# Patient Record
Sex: Male | Born: 1990 | Race: White | Hispanic: No | Marital: Single | Smoking: Current every day smoker
Health system: Southern US, Community
[De-identification: ages and names within clinical notes are randomized; demographics above are authoritative.]

## PROBLEM LIST (undated history)

## (undated) VITALS — BP 138/80 | HR 101 | Temp 97.0°F | Resp 19 | Ht 62.0 in | Wt 175.0 lb

## (undated) DIAGNOSIS — H3320 Serous retinal detachment, unspecified eye: Secondary | ICD-10-CM

## (undated) DIAGNOSIS — S42301A Unspecified fracture of shaft of humerus, right arm, initial encounter for closed fracture: Secondary | ICD-10-CM

## (undated) DIAGNOSIS — J45909 Unspecified asthma, uncomplicated: Secondary | ICD-10-CM

## (undated) DIAGNOSIS — F99 Mental disorder, not otherwise specified: Secondary | ICD-10-CM

## (undated) DIAGNOSIS — J189 Pneumonia, unspecified organism: Secondary | ICD-10-CM

## (undated) DIAGNOSIS — I10 Essential (primary) hypertension: Secondary | ICD-10-CM

## (undated) HISTORY — DX: Pneumonia, unspecified organism: J18.9

## (undated) HISTORY — PX: OTHER SURGICAL HISTORY: SHX169

## (undated) HISTORY — PX: CARDIAC SURGERY: SHX584

## (undated) HISTORY — DX: Serous retinal detachment, unspecified eye: H33.20

## (undated) HISTORY — DX: Essential (primary) hypertension: I10

## (undated) HISTORY — DX: Unspecified asthma, uncomplicated: J45.909

---
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
## 2009-05-02 ENCOUNTER — Ambulatory Visit: Payer: Self-pay | Admitting: Family Medicine

## 2009-05-02 DIAGNOSIS — B079 Viral wart, unspecified: Secondary | ICD-10-CM | POA: Insufficient documentation

## 2009-05-02 DIAGNOSIS — R21 Rash and other nonspecific skin eruption: Secondary | ICD-10-CM | POA: Insufficient documentation

## 2009-05-04 LAB — CONVERTED CEMR LAB
Albumin: 4.7 g/dL (ref 3.5–5.2)
Alkaline Phosphatase: 63 units/L (ref 39–117)
Basophils Absolute: 0 10*3/uL (ref 0.0–0.1)
Bilirubin, Direct: 0.3 mg/dL (ref 0.0–0.3)
Calcium: 9.9 mg/dL (ref 8.4–10.5)
Cholesterol: 111 mg/dL (ref 0–200)
HDL: 38.5 mg/dL — ABNORMAL LOW (ref >39.00)
Hemoglobin: 14.9 g/dL (ref 13.0–17.0)
MCHC: 34.7 g/dL (ref 30.0–36.0)
MCV: 91.9 fL (ref 78.0–100.0)
Monocytes Absolute: 0.3 10*3/uL (ref 0.1–1.0)
Neutro Abs: 7.1 10*3/uL (ref 1.4–7.7)
RBC: 4.67 M/uL (ref 4.22–5.81)
RDW: 11.9 % (ref 11.5–14.6)
Total CHOL/HDL Ratio: 3
Triglycerides: 63 mg/dL (ref 0.0–149.0)
VLDL: 12.6 mg/dL (ref 0.0–40.0)
WBC: 10.6 10*3/uL — ABNORMAL HIGH (ref 4.5–10.5)

## 2009-05-31 ENCOUNTER — Encounter: Payer: Self-pay | Admitting: Family Medicine

## 2009-08-29 ENCOUNTER — Emergency Department (HOSPITAL_COMMUNITY): Admission: EM | Admit: 2009-08-29 | Discharge: 2009-08-29 | Payer: Self-pay | Admitting: Emergency Medicine

## 2009-12-15 HISTORY — PX: OTHER SURGICAL HISTORY: SHX169

## 2010-02-27 ENCOUNTER — Ambulatory Visit: Payer: Self-pay | Admitting: Physical Medicine & Rehabilitation

## 2010-03-01 ENCOUNTER — Inpatient Hospital Stay (HOSPITAL_COMMUNITY)
Admission: RE | Admit: 2010-03-01 | Discharge: 2010-03-07 | Payer: Self-pay | Admitting: Physical Medicine & Rehabilitation

## 2010-03-07 ENCOUNTER — Encounter: Payer: Self-pay | Admitting: Family Medicine

## 2010-11-21 ENCOUNTER — Inpatient Hospital Stay (HOSPITAL_COMMUNITY): Admission: EM | Admit: 2010-11-21 | Discharge: 2010-03-01 | Payer: Self-pay | Admitting: Emergency Medicine
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
## 2011-01-25 ENCOUNTER — Emergency Department (HOSPITAL_COMMUNITY)
Admission: EM | Admit: 2011-01-25 | Discharge: 2011-01-25 | Disposition: A | Discharge status: Home / Self Care | Payer: 59 | Attending: Emergency Medicine | Admitting: Emergency Medicine

## 2011-01-25 DIAGNOSIS — J45909 Unspecified asthma, uncomplicated: Secondary | ICD-10-CM | POA: Insufficient documentation

## 2011-01-25 DIAGNOSIS — F17200 Nicotine dependence, unspecified, uncomplicated: Secondary | ICD-10-CM | POA: Insufficient documentation

## 2011-01-25 DIAGNOSIS — R4182 Altered mental status, unspecified: Secondary | ICD-10-CM | POA: Insufficient documentation

## 2011-01-25 LAB — POCT I-STAT, CHEM 8
BUN: 7 mg/dL (ref 6–23)
Calcium, Ion: 1.14 mmol/L (ref 1.12–1.32)
Chloride: 105 mEq/L (ref 96–112)
Creatinine, Ser: 0.9 mg/dL (ref 0.4–1.5)
Hemoglobin: 16 g/dL (ref 13.0–17.0)
TCO2: 21 mmol/L (ref 0–100)

## 2011-01-25 LAB — RAPID URINE DRUG SCREEN, HOSP PERFORMED
Barbiturates: NOT DETECTED
Benzodiazepines: NOT DETECTED
Tetrahydrocannabinol: POSITIVE — AB

## 2011-02-19 ENCOUNTER — Ambulatory Visit (HOSPITAL_COMMUNITY): Payer: 59 | Admitting: Physician Assistant

## 2011-02-21 ENCOUNTER — Emergency Department (HOSPITAL_COMMUNITY): Payer: 59

## 2011-02-21 ENCOUNTER — Inpatient Hospital Stay (HOSPITAL_COMMUNITY)
Admission: AD | Admit: 2011-02-21 | Discharge: 2011-03-03 | DRG: 885 | Disposition: A | Discharge status: Home / Self Care | Payer: 59 | Source: Ambulatory Visit | Attending: Psychiatry | Admitting: Psychiatry

## 2011-02-21 ENCOUNTER — Emergency Department (HOSPITAL_COMMUNITY)
Admission: EM | Admit: 2011-02-21 | Discharge: 2011-02-21 | Disposition: A | Discharge status: Other Acute Inpatient Hospital | Payer: 59 | Attending: Emergency Medicine | Admitting: Emergency Medicine

## 2011-02-21 DIAGNOSIS — H5440 Blindness, one eye, unspecified eye: Secondary | ICD-10-CM

## 2011-02-21 DIAGNOSIS — F29 Unspecified psychosis not due to a substance or known physiological condition: Secondary | ICD-10-CM

## 2011-02-21 DIAGNOSIS — R404 Transient alteration of awareness: Secondary | ICD-10-CM | POA: Insufficient documentation

## 2011-02-21 DIAGNOSIS — H5316 Psychophysical visual disturbances: Secondary | ICD-10-CM | POA: Insufficient documentation

## 2011-02-21 DIAGNOSIS — J45909 Unspecified asthma, uncomplicated: Secondary | ICD-10-CM

## 2011-02-21 DIAGNOSIS — F1210 Cannabis abuse, uncomplicated: Secondary | ICD-10-CM

## 2011-02-21 LAB — RAPID URINE DRUG SCREEN, HOSP PERFORMED
Amphetamines: NOT DETECTED
Barbiturates: NOT DETECTED
Benzodiazepines: POSITIVE — AB
Tetrahydrocannabinol: POSITIVE — AB

## 2011-02-21 LAB — URINALYSIS, ROUTINE W REFLEX MICROSCOPIC
Bilirubin Urine: NEGATIVE
Nitrite: NEGATIVE
Specific Gravity, Urine: 1.02 (ref 1.005–1.030)

## 2011-02-21 LAB — COMPREHENSIVE METABOLIC PANEL
ALT: 20 U/L (ref 0–53)
AST: 31 U/L (ref 0–37)
BUN: 13 mg/dL (ref 6–23)
Calcium: 9.2 mg/dL (ref 8.4–10.5)
Creatinine, Ser: 0.85 mg/dL (ref 0.4–1.5)
GFR calc non Af Amer: >60 mL/min (ref >60)
Glucose, Bld: 115 mg/dL — ABNORMAL HIGH (ref 70–99)
Potassium: 3.8 mEq/L (ref 3.5–5.1)
Total Protein: 7 g/dL (ref 6.0–8.3)

## 2011-02-21 LAB — CBC
Hemoglobin: 13.6 g/dL (ref 13.0–17.0)
MCH: 31.3 pg (ref 26.0–34.0)
MCHC: 33.6 g/dL (ref 30.0–36.0)
MCV: 93.3 fL (ref 78.0–100.0)
RDW: 12.5 % (ref 11.5–15.5)

## 2011-02-21 LAB — DIFFERENTIAL
Basophils Relative: 1 % (ref 0–1)
Lymphocytes Relative: 17 % (ref 12–46)
Monocytes Relative: 7 % (ref 3–12)
Neutro Abs: 6.6 10*3/uL (ref 1.7–7.7)
Neutrophils Relative %: 72 % (ref 43–77)

## 2011-02-22 DIAGNOSIS — F259 Schizoaffective disorder, unspecified: Secondary | ICD-10-CM

## 2011-02-25 LAB — COMPREHENSIVE METABOLIC PANEL
ALT: 25 U/L (ref 0–53)
Alkaline Phosphatase: 57 U/L (ref 39–117)
BUN: 10 mg/dL (ref 6–23)
Chloride: 105 mEq/L (ref 96–112)
GFR calc Af Amer: >60 mL/min (ref >60)
Potassium: 3.5 mEq/L (ref 3.5–5.1)
Sodium: 139 mEq/L (ref 135–145)
Total Bilirubin: 0.6 mg/dL (ref 0.3–1.2)

## 2011-02-25 LAB — HEMOGLOBIN A1C: Mean Plasma Glucose: 114 mg/dL (ref <117)

## 2011-02-25 LAB — LIPID PANEL
Cholesterol: 97 mg/dL (ref 0–200)
LDL Cholesterol: 43 mg/dL (ref 0–99)
VLDL: 10 mg/dL (ref 0–40)

## 2011-02-25 LAB — VALPROIC ACID LEVEL: Valproic Acid Lvl: 116.1 ug/mL — ABNORMAL HIGH (ref 50.0–100.0)
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
## 2011-03-10 LAB — CBC
HCT: 33.6 % — ABNORMAL LOW (ref 39.0–52.0)
HCT: 47.4 % (ref 39.0–52.0)
Hemoglobin: 11.4 g/dL — ABNORMAL LOW (ref 13.0–17.0)
Hemoglobin: 16.2 g/dL (ref 13.0–17.0)
MCHC: 34.1 g/dL (ref 30.0–36.0)
MCHC: 35.1 g/dL (ref 30.0–36.0)
MCV: 94.5 fL (ref 78.0–100.0)
MCV: 95.8 fL (ref 78.0–100.0)
Platelets: 190 10*3/uL (ref 150–400)
Platelets: 256 10*3/uL (ref 150–400)
Platelets: 312 10*3/uL (ref 150–400)
RBC: 4.99 MIL/uL (ref 4.22–5.81)
RDW: 12.6 % (ref 11.5–15.5)
WBC: 11.4 10*3/uL — ABNORMAL HIGH (ref 4.0–10.5)
WBC: 7.2 10*3/uL (ref 4.0–10.5)

## 2011-03-10 LAB — BASIC METABOLIC PANEL
BUN: 11 mg/dL (ref 6–23)
BUN: 7 mg/dL (ref 6–23)
CO2: 22 mEq/L (ref 19–32)
Calcium: 9.2 mg/dL (ref 8.4–10.5)
Chloride: 107 mEq/L (ref 96–112)
Creatinine, Ser: 0.74 mg/dL (ref 0.4–1.5)
Creatinine, Ser: 0.76 mg/dL (ref 0.4–1.5)
GFR calc non Af Amer: >60 mL/min (ref >60)
Glucose, Bld: 123 mg/dL — ABNORMAL HIGH (ref 70–99)
Potassium: 4.3 mEq/L (ref 3.5–5.1)

## 2011-03-10 LAB — COMPREHENSIVE METABOLIC PANEL
ALT: 28 U/L (ref 0–53)
Albumin: 3.1 g/dL — ABNORMAL LOW (ref 3.5–5.2)
Alkaline Phosphatase: 45 U/L (ref 39–117)
Alkaline Phosphatase: 67 U/L (ref 39–117)
BUN: 11 mg/dL (ref 6–23)
BUN: 11 mg/dL (ref 6–23)
CO2: 21 mEq/L (ref 19–32)
Chloride: 101 mEq/L (ref 96–112)
GFR calc non Af Amer: >60 mL/min (ref >60)
Glucose, Bld: 121 mg/dL — ABNORMAL HIGH (ref 70–99)
Potassium: 3.4 mEq/L — ABNORMAL LOW (ref 3.5–5.1)
Potassium: 3.7 mEq/L (ref 3.5–5.1)
Sodium: 138 mEq/L (ref 135–145)
Total Bilirubin: 0.8 mg/dL (ref 0.3–1.2)

## 2011-03-10 LAB — DIFFERENTIAL
Basophils Absolute: 0.1 10*3/uL (ref 0.0–0.1)
Basophils Relative: 1 % (ref 0–1)
Eosinophils Absolute: 0.5 10*3/uL (ref 0.0–0.7)
Eosinophils Relative: 7 % — ABNORMAL HIGH (ref 0–5)
Monocytes Absolute: 0.8 10*3/uL (ref 0.1–1.0)
Neutro Abs: 3.6 10*3/uL (ref 1.7–7.7)

## 2011-03-10 LAB — POCT I-STAT, CHEM 8
Chloride: 108 mEq/L (ref 96–112)
Creatinine, Ser: 0.9 mg/dL (ref 0.4–1.5)
Hemoglobin: 17 g/dL (ref 13.0–17.0)
Potassium: 3.8 mEq/L (ref 3.5–5.1)
Sodium: 138 mEq/L (ref 135–145)

## 2011-03-10 LAB — LACTIC ACID, PLASMA: Lactic Acid, Venous: 4.4 mmol/L — ABNORMAL HIGH (ref 0.5–2.2)
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
## 2012-08-03 ENCOUNTER — Encounter: Payer: Self-pay | Admitting: Family Medicine

## 2012-08-03 ENCOUNTER — Ambulatory Visit (INDEPENDENT_AMBULATORY_CARE_PROVIDER_SITE_OTHER): Payer: 59 | Admitting: Family Medicine

## 2012-08-03 VITALS — BP 120/64 | Temp 97.1°F | Ht 62.0 in | Wt 190.8 lb

## 2012-08-03 DIAGNOSIS — F209 Schizophrenia, unspecified: Secondary | ICD-10-CM | POA: Insufficient documentation

## 2012-08-03 NOTE — Assessment & Plan Note (Signed)
Pt per hx has had inpatient stay last year followed by treatment (with admin of med at home)  After 9 mo off med (they cannot tell me which)- he is again having hallucinations (visual/ auditory), confusion, difficulty concentrating and wandering behavior  Here for ref to behavioral health- will do asap  Advised if symptoms worsen or he becomes threat to self or others will take him to ER for eval immediately Otherwise family will keep him with them at all times
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
## 2012-08-03 NOTE — Patient Instructions (Addendum)
Please stop and see [name] at check out for referral to behavioral health  If symptoms worsen or you think Hunter [name] is a threat to himself or others- please take him to ER

## 2012-08-04 ENCOUNTER — Inpatient Hospital Stay (HOSPITAL_COMMUNITY)
Admission: EM | Admit: 2012-08-04 | Discharge: 2012-08-13 | DRG: 493 | Disposition: A | Discharge status: Other Acute Inpatient Hospital | Payer: 59 | Attending: Orthopedic Surgery | Admitting: Orthopedic Surgery

## 2012-08-04 ENCOUNTER — Encounter (HOSPITAL_COMMUNITY): Payer: Self-pay | Admitting: *Deleted

## 2012-08-04 ENCOUNTER — Emergency Department (HOSPITAL_COMMUNITY): Payer: 59

## 2012-08-04 DIAGNOSIS — Z8669 Personal history of other diseases of the nervous system and sense organs: Secondary | ICD-10-CM

## 2012-08-04 DIAGNOSIS — S42309A Unspecified fracture of shaft of humerus, unspecified arm, initial encounter for closed fracture: Secondary | ICD-10-CM

## 2012-08-04 DIAGNOSIS — F29 Unspecified psychosis not due to a substance or known physiological condition: Secondary | ICD-10-CM

## 2012-08-04 DIAGNOSIS — F209 Schizophrenia, unspecified: Secondary | ICD-10-CM

## 2012-08-04 DIAGNOSIS — X58XXXA Exposure to other specified factors, initial encounter: Secondary | ICD-10-CM

## 2012-08-04 DIAGNOSIS — S42413A Displaced simple supracondylar fracture without intercondylar fracture of unspecified humerus, initial encounter for closed fracture: Principal | ICD-10-CM | POA: Diagnosis present

## 2012-08-04 DIAGNOSIS — Y9210 Unspecified residential institution as the place of occurrence of the external cause: Secondary | ICD-10-CM | POA: Diagnosis present

## 2012-08-04 DIAGNOSIS — R339 Retention of urine, unspecified: Secondary | ICD-10-CM | POA: Diagnosis not present

## 2012-08-04 DIAGNOSIS — F200 Paranoid schizophrenia: Secondary | ICD-10-CM

## 2012-08-04 LAB — COMPREHENSIVE METABOLIC PANEL
ALT: 56 U/L — ABNORMAL HIGH (ref 0–53)
AST: 128 U/L — ABNORMAL HIGH (ref 0–37)
Albumin: 4.4 g/dL (ref 3.5–5.2)
CO2: 20 mEq/L (ref 19–32)
Calcium: 9.4 mg/dL (ref 8.4–10.5)
Chloride: 101 mEq/L (ref 96–112)
GFR calc non Af Amer: >90 mL/min (ref >90)
Sodium: 137 mEq/L (ref 135–145)
Total Bilirubin: 1 mg/dL (ref 0.3–1.2)

## 2012-08-04 LAB — CBC WITH DIFFERENTIAL/PLATELET
Basophils Absolute: 0.1 10*3/uL (ref 0.0–0.1)
Lymphocytes Relative: 21 % (ref 12–46)
Neutro Abs: 9.9 10*3/uL — ABNORMAL HIGH (ref 1.7–7.7)
Platelets: 262 10*3/uL (ref 150–400)
RDW: 12.5 % (ref 11.5–15.5)
WBC: 14.4 10*3/uL — ABNORMAL HIGH (ref 4.0–10.5)

## 2012-08-04 LAB — RAPID URINE DRUG SCREEN, HOSP PERFORMED
Amphetamines: NOT DETECTED
Barbiturates: NOT DETECTED
Cocaine: NOT DETECTED
Tetrahydrocannabinol: POSITIVE — AB

## 2012-08-04 MED ORDER — OXYCODONE-ACETAMINOPHEN 5-325 MG PO TABS
2.0000 | ORAL_TABLET | Freq: Four times a day (QID) | ORAL | Status: DC | PRN
  Administered 2012-08-05 – 2012-08-07 (×6): 2 via ORAL
  Administered 2012-08-08: 1 via ORAL
  Administered 2012-08-08 – 2012-08-10 (×4): 2 via ORAL
  Filled 2012-08-04 (×12): qty 2

## 2012-08-04 MED ORDER — VALPROIC ACID 250 MG PO CAPS
1000.0000 mg | ORAL_CAPSULE | Freq: Every day | ORAL | Status: DC
  Administered 2012-08-04 – 2012-08-12 (×9): 1000 mg via ORAL
  Filled 2012-08-04 (×11): qty 4

## 2012-08-04 MED ORDER — FENTANYL CITRATE 0.05 MG/ML IJ SOLN
100.0000 ug | Freq: Once | INTRAMUSCULAR | Status: AC
  Administered 2012-08-04: 100 ug via INTRAVENOUS
  Filled 2012-08-04 (×2): qty 2

## 2012-08-04 MED ORDER — CLONAZEPAM 1 MG PO TABS
1.0000 mg | ORAL_TABLET | Freq: Every day | ORAL | Status: DC
  Administered 2012-08-04 – 2012-08-13 (×9): 1 mg via ORAL
  Filled 2012-08-04 (×3): qty 2
  Filled 2012-08-04: qty 1
  Filled 2012-08-04 (×2): qty 2
  Filled 2012-08-04: qty 1
  Filled 2012-08-04: qty 2
  Filled 2012-08-04: qty 1

## 2012-08-04 MED ORDER — RISPERIDONE 1 MG PO TABS
1.0000 mg | ORAL_TABLET | Freq: Every day | ORAL | Status: DC
  Administered 2012-08-04 – 2012-08-05 (×2): 1 mg via ORAL
  Filled 2012-08-04 (×2): qty 1

## 2012-08-04 MED ORDER — IBUPROFEN 600 MG PO TABS
600.0000 mg | ORAL_TABLET | Freq: Three times a day (TID) | ORAL | Status: DC | PRN
  Administered 2012-08-05 – 2012-08-09 (×4): 600 mg via ORAL
  Filled 2012-08-04: qty 1
  Filled 2012-08-04 (×4): qty 3

## 2012-08-04 MED ORDER — SODIUM CHLORIDE 0.9 % IV SOLN
Freq: Once | INTRAVENOUS | Status: AC
  Administered 2012-08-04 – 2012-08-05 (×2): via INTRAVENOUS

## 2012-08-04 MED ORDER — LORAZEPAM 2 MG/ML IJ SOLN
1.0000 mg | Freq: Once | INTRAMUSCULAR | Status: AC
  Administered 2012-08-04: 19:00:00 via INTRAVENOUS
  Filled 2012-08-04: qty 1

## 2012-08-04 MED ORDER — HYDROMORPHONE HCL PF 2 MG/ML IJ SOLN
INTRAMUSCULAR | Status: AC
  Filled 2012-08-04: qty 1

## 2012-08-04 MED ORDER — LORAZEPAM 1 MG PO TABS
1.0000 mg | ORAL_TABLET | Freq: Three times a day (TID) | ORAL | Status: DC | PRN
  Administered 2012-08-04 – 2012-08-13 (×6): 1 mg via ORAL
  Filled 2012-08-04 (×6): qty 1
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
## 2012-08-04 NOTE — ED Notes (Signed)
Pt redirected back to room.

## 2012-08-04 NOTE — ED Notes (Signed)
Pt attempting to go into other patients room.  Refusing to go back into his room, escorted back to his room by this nurse.

## 2012-08-04 NOTE — ED Notes (Signed)
Mom and dad at bedside pain meds given

## 2012-08-04 NOTE — ED Notes (Signed)
  Dr Stephenie Acres at bedside complete assessment done Pt was asked  if he wanted pain meds via iv or in his arm (im) he replied iv
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
## 2012-08-04 NOTE — ED Notes (Signed)
Pt is very uncooperative towards me and the RN. Vernita Banker) was unable to put an IV in because he would continuously fight against [place] even after his father asked him to behave.  Pt coughed into my face as a way to tell me to get away from him as I was trying to help RN while she was trying to put in the IV.
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
## 2012-08-04 NOTE — ED Notes (Addendum)
Pt changed into blue scrubs. Pants, underwear, shirt, shoes placed in one belonging bag. Father will take belongings home.
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
## 2012-08-04 NOTE — ED Notes (Signed)
This nt was sitting at the desk and witnessed pt pacing in the hall. Pt was redirected several times, notified by RN Katrinka Blazing, of patient privacy and that there are other patients in his vicinity. Patient trying to walk into other patient's rooms. When patient was told he could not do that the patient then became agitated with RN Katrinka Blazing as he attempted to redirect pt back to his room. Upon another attempt to redirect patient, this writer witnessed patient swing once at [employer]. This nt watched RN Katrinka Blazing use CIRT methods lower patient. This Clinical research associate went into hall to assist [employer] with patient. RN Arnette Felts called security and [employer] to assist RN Katrinka Blazing with patient. Patient assisted back to his room by RN Katrinka Blazing, [employer], and Security.

## 2012-08-04 NOTE — ED Notes (Signed)
Pt brought in by father. Was taken to [facility], sent here for med clearance. Accepted at [facility] by Walker, no bed available yet. Pt will not speak to staff. Appears to be responding to internal stimuli, laughing inappropriately. Will breath heavy and loud as if he is getting angry, able to be redirected, but still will not answer questions. Father sts pt has been confused, acting inappropriately. Called him yesterday and was speaking "jibberish." Received another call a few hours later from a unknown number, pt stated he was at a friend's house, father spoke with this "friend" who stated he had never met pt, pt just showed up at his house. Father had to go and pick pt up. At [facility] today, pt threw his wallet away for no reason. Unable to assess SI/HI due to pt not speaking to staff.
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
## 2012-08-04 NOTE — ED Notes (Signed)
Pt redirected to room.

## 2012-08-04 NOTE — ED Notes (Signed)
This rn was sitting at desk, saw Katrinka Blazing, rn talking to pt in hall attmepting to redirect pt to go back into his room after visualizing pt pacing the floor, trying to walk into other rooms, and agitating other pts. This rn saw pt swing once at [place], rn. [place], nt then went out into hall to assist Lind Covert, rn asked Arnette Felts, rn to call security. This rn called security and requested they come to pysch ED for assistance. This rn went to door of desk area and saw pt sitting on buttocks on ground. This rn went back into desk area. Security arrived and assisted Katrinka Blazing, rn to get pt into room. This rn placed order for xray of pt right arm.

## 2012-08-04 NOTE — ED Notes (Signed)
Pt very limited in speech.  Would not answer many questions.  Did say he was not suicidal or homicidal.

## 2012-08-04 NOTE — ED Notes (Addendum)
Mother came out of the room and asked me how the Pt was supposed to use the bathroom. I went into the room and asked Pt if he just needed to urinate. He stated, "[place], yeah, I just need to pee." So I stepped out and grabbed a urinal and returned to the room. I asked Pt's mom if she would like to just step and she replied, "I don't think he would really care right now. Dylyn, do you want me to step out and let her just help you or just turn my back towards you?" The Pt did not respond, so she just turned around and I explained to Pt how to use the urinal and that I was going to help pull down the top front of his pants and put the urinal in place for him to urinate. After his penis was in the urinal, he reached his hand down and grabbed it. After about 10 seconds he pulled his penis facing me and before I could comprehend what exactly he was doing, he started urinating on me. It went up my scrub top and he urinated all down my pant leg to my shoe. I had to stop what I was doing, step to the door and ask Ayana ( another NT) to please come and help him. I informed her that the Pt just urinated on me. I went and changed my scubs immediately.

## 2012-08-04 NOTE — ED Notes (Signed)
Attempted to started iv pt refusing,  father at bedside to help encourage insertion of iv will continue to try.

## 2012-08-04 NOTE — ED Notes (Signed)
Pt refusing pain iv and/or im did well with splint application without pain meds father and mother at bedside.

## 2012-08-04 NOTE — ED Notes (Signed)
Notified Dr Judie Petit.Hunt of incident.  She stated she would advise Dr Lorenso Courier of incident as well.

## 2012-08-04 NOTE — ED Notes (Signed)
During assessment, pt was very limited in answering questions but only would posture and blow as if he was becoming agitated.  This nurse advised him that when he was ready to speak with staff we would be available to him.  Pt acknowledged with nod yes.

## 2012-08-05 NOTE — ED Notes (Signed)
Left a message with Dr. Veda Canning answering service asking for Dr. Ave Filter to notify nurse of how he plans to follow up with the patient since he will be either at [facility] or [facility] tomorrow.  Canceled appointment scheduled for 8/23 at [facility].  Provided call back number [phone].

## 2012-08-05 NOTE — ED Provider Notes (Signed)
Filed Vitals:   08/05/12 1245  BP: 128/79  Pulse: 109  Temp: 98.2 F (36.8 C)  Resp: 20   Patient continues awaiting placement for 400 bed. Patient with pain from extremity fracture well controlled.  Cyndra Numbers, MD 08/05/12 [phone]

## 2012-08-05 NOTE — ED Notes (Signed)
Patient's mother reports that she is leaving at this time. Patient took medication with no problems.  Reassured mother that patient would be all right and that we would take good care of patient. Mother left and patient lying quietly in bed. Respirations equal and unlabored. Skin warm and dry. No acute distress noted.
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
## 2012-08-06 MED ORDER — RISPERIDONE 2 MG PO TABS
2.0000 mg | ORAL_TABLET | Freq: Every day | ORAL | Status: DC
  Administered 2012-08-06 – 2012-08-12 (×7): 2 mg via ORAL
  Filled 2012-08-06 (×3): qty 1
  Filled 2012-08-06: qty 2
  Filled 2012-08-06 (×4): qty 1

## 2012-08-06 NOTE — ED Notes (Signed)
Pt sitting up in chair, c/o pain in right arm, sling applied to right arm, pain medication given; pt not very communicative--answers few questions and responds w/ one word answers; sitter sitting w/ pt in view.
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
## 2012-08-06 NOTE — ED Notes (Signed)
Ortho Tech in and replaced DSG on right arm.

## 2012-08-06 NOTE — BHH Counselor (Addendum)
Spoke with Tori [facility]) @ [facility] regarding direction on disposition and placement for this pt. She stated she would speak with their on-call physician and get back with ACT regarding placement.  TC back from [place] and informed of plan of action. Per Dr. Elsie Saas, who is the [facility] physician on call, pt is to remain in the ED, where he will treat him until time of his surgery. Reported that he had made changes to his medications today on his psychiatric evaluation. Delorise Jackson will also communicate this information to [facility] staff. Updated RN and EDP.

## 2012-08-06 NOTE — Progress Notes (Signed)
Orthopedic Tech Progress Note Patient Details:  Hunter [name] 04-15-1991 [id number]  Ortho Devices Type of Ortho Device: Post (long) splint;Coapt Splint Material: Plaster Ortho Device/Splint Location: Rt arm Ortho Device/Splint Interventions: Application   Cammer, Mickie Bail 08/06/2012, 12:35 PM
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
## 2012-08-06 NOTE — ED Notes (Signed)
Sat with pt from 2015 to 2045

## 2012-08-06 NOTE — ED Notes (Signed)
Pt tearful, restless; denies pain in arm; states "I'm not worried about my arm, it's the rest of the world".  Ativan 1 mg po given.

## 2012-08-06 NOTE — ED Provider Notes (Signed)
Pt was seen by dr. Ave Filter.  He will take pt to or next week.  Cheri Guppy, MD 08/06/12 [phone]

## 2012-08-06 NOTE — ED Notes (Signed)
Ortho Tech notified pt removed DSG on right arm, stating " it's to tight" .

## 2012-08-06 NOTE — BHH Counselor (Signed)
Per Theodoro Kos, [facility], Pt's clinical information was reviewed by Dr. Harvie Heck Readling. Dr. Allena Katz declined Pt due to his acuity and aggressive behavior. Notified Charyl Bigger, assessment counselor at [facility], of disposition.  Harlin Rain Patsy [place], LPC

## 2012-08-06 NOTE — ED Notes (Signed)
Gave patient a bag of chips

## 2012-08-07 NOTE — ED Notes (Addendum)
Patient awake, eating, sitter at bedside. Denies pain. Palpable pulse in rt. Arm. Skin is warm to touch. Ecchymosis is noted to LUE.

## 2012-08-07 NOTE — ED Notes (Signed)
Received report on patient. Rounded on patient. Pt sitting up in bed with sitter at bedside. Denies any needs at this time. Sitter remains at bedside.

## 2012-08-07 NOTE — ED Provider Notes (Addendum)
Pt alert, calm/content, nad. No c/o currently. Discussed w act team - awaiting placement.     Suzi Roots, MD 08/07/12 [phone]   Pt alert, content. Nad. Awaiting placement.   Discussed w ortho, Dr Ave Filter, as regards plan with humerus fracture.  He indicates he has seen, and that pt will go to OR tomorrow morning for orif, states will be in hospital on ortho/surgical floor overnight after surgery. At that point, pt will need psych team to either arrange psych transfer to inpt psych unit elsewhere (or facilitate d/c if they deem patient psychiatrically stable for discharge with outpatient follow up).     Suzi Roots, MD 08/09/12 [id number]  Suzi Roots, MD 08/09/12 1118

## 2012-08-07 NOTE — ED Notes (Signed)
Pt in bed talking with Dad (at bedside). Asked to ambulate around unit; but this RN did not feel it was safe for pt to do same as he has been medicated and did not seem steady when up. Pt resting in bed at the moment.
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
## 2012-08-07 NOTE — ED Notes (Signed)
Patient is resting comfortably. Denies need for pain medication. Encourage to call out when needed.
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
## 2012-08-08 LAB — URINALYSIS, ROUTINE W REFLEX MICROSCOPIC
Glucose, UA: NEGATIVE mg/dL
Ketones, ur: NEGATIVE mg/dL
Leukocytes, UA: NEGATIVE
Protein, ur: NEGATIVE mg/dL
Urobilinogen, UA: 1 mg/dL (ref 0.0–1.0)

## 2012-08-08 NOTE — ED Notes (Signed)
Patient complain about ace wrap being to tight to the right arm assessed ace wrap and encouraged patient to cont to keep arm elevated. In addition, patient was reassessed by ortho tech encourages to elevate arm as well.

## 2012-08-08 NOTE — ED Notes (Signed)
IV site flushed and line secured. Site bruised

## 2012-08-08 NOTE — ED Notes (Addendum)
Pt unwrapped his splint that was placed on his right arm. Splint was out of place.  Repositioned splint and ace wrap to right arm. Pt is slightly confused, Arousable and oriented to person, place. Slightly disoriented to time. He is able to recall DOB and the president of the [place]. He denies pain to rt arm.  IV was wrapped on Lt forearm. Pt asked what is it and its use for the IV placement.   Palpable pulse  In rt arm . Ecchymosis  And edema noted on RUE. Skin is warm to touch and cap RF is WNL.
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
## 2012-08-08 NOTE — ED Provider Notes (Addendum)
Pt is awake, watching television, NAD.  Stable VS.  Pt denies any complaints at this time.  Awaiting placement.  Tobin Chad, MD 08/08/12 0748  Pt is awake, sitting in chair, watching TV, NAD.  Note stable VS.  No acute events reported from overnight shift.  Pt is scheduled for surgery to repair humerus fx this morning at 0945.  Will continue to monitor.  Tobin Chad, MD 08/10/12 [phone]

## 2012-08-08 NOTE — ED Notes (Signed)
Patient is resting comfortably. Sitter at bedside.  

## 2012-08-08 NOTE — ED Notes (Signed)
Pt has had an uneventful day thus far. He walked with the sitter in the halls around nursing station. Ambulated well. Father came to visit for aprox 1hr. Pt tolerated well and sat up in recliner to prepare for lunch during this visit. Pt requested to shower. Informed him that due to his RUE and the wrap that he could not take a shower, however with my assistance and the sitter we could assist him in getting washed up and his hair groomed. Pt verbalized understanding and appreciation. He has continued to refuse pain medicines thus far today. States the most his pain has been is a 2 on a 0-10scale. Offered pain meds several times and he stated he was comfortable, yet assured me if the pain level changed he would let me know asap. Will continue to closely monitor pt.

## 2012-08-08 NOTE — ED Notes (Signed)
Patient is resting comfortably. 

## 2012-08-09 LAB — GLUCOSE, CAPILLARY: Glucose-Capillary: 103 mg/dL — ABNORMAL HIGH (ref 70–99)

## 2012-08-09 MED ORDER — KCL IN DEXTROSE-NACL 20-5-0.45 MEQ/L-%-% IV SOLN
INTRAVENOUS | Status: DC
  Administered 2012-08-10: via INTRAVENOUS
  Filled 2012-08-09: qty 1000

## 2012-08-09 NOTE — ED Notes (Signed)
Patient removed soft cast from rt arm. Patient stated that he was relieved he got it off. Noted mult. Bruises to upper portion of arm humerus. Swollen radial pulse palpable. Hand remains swollen. Not change from original assessment. Replaced rt arm back in soft cast and wrapped with ace bandage. Patient tolerated it well. Did not open his eyes. Nurse tech asst with applying support back on.

## 2012-08-09 NOTE — ED Notes (Signed)
Patient has had multiple trips to bathroom  To urinate.  patient is extremely thirsty. Patient unable to sleep for having to get oob to urinate. Check blood sugar and it was 103. Will cont to asst to bathroom and cont to monitor

## 2012-08-09 NOTE — ED Notes (Signed)
Informed Casimiro Needle, patient's father, of time of surgery tomorrow at 47.  Informed him of need for consent for surgery which we'll get tonight when patient's mother visits.  Spoke with Dr. Denton Lank to see if ortho surgeon will order for patient to be moved to floor for pre-surgical treatment verses waiting to transfer after surgery.  Currently ortho surgeon Dr. Ave Filter wants patient to stay in ED until after surgery.  Awaiting decision between Dr. Denton Lank (ED doctor) and Dr. Ave Filter at this moment after they discuss need to open bed in ED.  No other concerns at this time.  Barrie Lyme 2:38 PM 08/09/2012

## 2012-08-09 NOTE — Progress Notes (Signed)
PATIENT ID: Hunter [name]     Procedure(s) (LRB): OPEN REDUCTION INTERNAL FIXATION (ORIF) DISTAL HUMERUS FRACTURE (Right)  Subjective: Pain minimal in RUE. Splint never changed to coapt splint, but comfortable.  Objective:  Filed Vitals:   08/09/12 0525  BP: 121/75  Pulse: 106  Temp: 97.6 F (36.4 C)  Resp: 16     Splint intact.  NVID M/U/R.  Labs:  No results found for this basename: HGB:5 in the last 72 hoursNo results found for this basename: WBC:2,RBC:2,HCT:2,PLT:2 in the last 72 hoursNo results found for this basename: NA:2,K:2,CL:2,CO2:2,BUN:2,CREATININE:2,GLUCOSE:2,CALCIUM:2 in the last 72 hours  Assessment and Plan:R distal periprosthetic humerus fracture Plan ORIF tomorrow AM 9:45 NPO past midnight

## 2012-08-10 ENCOUNTER — Emergency Department (HOSPITAL_COMMUNITY): Payer: 59

## 2012-08-10 ENCOUNTER — Ambulatory Visit: Admit: 2012-08-10 | Payer: Self-pay | Admitting: Orthopedic Surgery

## 2012-08-10 ENCOUNTER — Emergency Department (HOSPITAL_COMMUNITY): Payer: 59 | Admitting: Registered Nurse

## 2012-08-10 ENCOUNTER — Encounter (HOSPITAL_COMMUNITY): Payer: Self-pay | Admitting: Registered Nurse

## 2012-08-10 ENCOUNTER — Encounter (HOSPITAL_COMMUNITY)
Admission: EM | Disposition: A | Discharge status: Other Acute Inpatient Hospital | Payer: Self-pay | Source: Home / Self Care

## 2012-08-10 HISTORY — PX: ORIF HUMERUS FRACTURE: SHX2126

## 2012-08-10 LAB — URINE CULTURE: Colony Count: 65000

## 2012-08-10 SURGERY — OPEN REDUCTION INTERNAL FIXATION (ORIF) DISTAL HUMERUS FRACTURE
Anesthesia: General | Site: Arm Upper | Laterality: Right | Wound class: Clean

## 2012-08-10 MED ORDER — SODIUM CHLORIDE 0.9 % IJ SOLN: 9.0000 mL | INTRAMUSCULAR | Status: DC | PRN

## 2012-08-10 MED ORDER — NALOXONE HCL 0.4 MG/ML IJ SOLN: 0.4000 mg | INTRAMUSCULAR | Status: DC | PRN

## 2012-08-10 MED ORDER — GLYCOPYRROLATE 0.2 MG/ML IJ SOLN
INTRAMUSCULAR | Status: DC | PRN
  Administered 2012-08-10: .8 mg via INTRAVENOUS

## 2012-08-10 MED ORDER — BUPIVACAINE HCL (PF) 0.25 % IJ SOLN
INTRAMUSCULAR | Status: AC
  Filled 2012-08-10: qty 30

## 2012-08-10 MED ORDER — DIPHENHYDRAMINE HCL 12.5 MG/5ML PO ELIX: 12.5000 mg | ORAL_SOLUTION | Freq: Four times a day (QID) | ORAL | Status: DC | PRN

## 2012-08-10 MED ORDER — HYDROMORPHONE HCL PF 1 MG/ML IJ SOLN
INTRAMUSCULAR | Status: DC | PRN
  Administered 2012-08-10 (×4): 0.5 mg via INTRAVENOUS

## 2012-08-10 MED ORDER — LACTATED RINGERS IV SOLN: INTRAVENOUS | Status: DC

## 2012-08-10 MED ORDER — METHOCARBAMOL 500 MG PO TABS
500.0000 mg | ORAL_TABLET | Freq: Four times a day (QID) | ORAL | Status: DC | PRN
Start: 2012-08-10 — End: 2012-08-13

## 2012-08-10 MED ORDER — METOCLOPRAMIDE HCL 5 MG/ML IJ SOLN: 5.0000 mg | Freq: Three times a day (TID) | INTRAMUSCULAR | Status: DC | PRN

## 2012-08-10 MED ORDER — OXYCODONE-ACETAMINOPHEN 5-325 MG PO TABS
1.0000 | ORAL_TABLET | ORAL | Status: DC | PRN
  Administered 2012-08-11 (×2): 1 via ORAL
  Administered 2012-08-11 – 2012-08-12 (×3): 2 via ORAL
  Filled 2012-08-10: qty 2
  Filled 2012-08-10: qty 1
  Filled 2012-08-10 (×2): qty 2
  Filled 2012-08-10: qty 1

## 2012-08-10 MED ORDER — MORPHINE SULFATE (PF) 1 MG/ML IV SOLN
INTRAVENOUS | Status: DC
Start: 2012-08-10 — End: 2012-08-10

## 2012-08-10 MED ORDER — LABETALOL HCL 5 MG/ML IV SOLN
INTRAVENOUS | Status: DC | PRN
  Administered 2012-08-10: 5 mg via INTRAVENOUS

## 2012-08-10 MED ORDER — MORPHINE SULFATE 2 MG/ML IJ SOLN: 1.0000 mg | INTRAMUSCULAR | Status: DC | PRN

## 2012-08-10 MED ORDER — BISACODYL 5 MG PO TBEC: 5.0000 mg | DELAYED_RELEASE_TABLET | Freq: Every day | ORAL | Status: DC | PRN

## 2012-08-10 MED ORDER — ZOLPIDEM TARTRATE 5 MG PO TABS: 5.0000 mg | ORAL_TABLET | Freq: Every evening | ORAL | Status: DC | PRN

## 2012-08-10 MED ORDER — PROMETHAZINE HCL 25 MG/ML IJ SOLN: 6.2500 mg | INTRAMUSCULAR | Status: DC | PRN

## 2012-08-10 MED ORDER — LACTATED RINGERS IV SOLN
INTRAVENOUS | Status: DC | PRN
  Administered 2012-08-10 (×2): via INTRAVENOUS

## 2012-08-10 MED ORDER — FENTANYL CITRATE 0.05 MG/ML IJ SOLN
INTRAMUSCULAR | Status: DC | PRN
  Administered 2012-08-10 (×2): 50 ug via INTRAVENOUS
  Administered 2012-08-10: 100 ug via INTRAVENOUS
  Administered 2012-08-10: 50 ug via INTRAVENOUS

## 2012-08-10 MED ORDER — METHOCARBAMOL 100 MG/ML IJ SOLN
500.0000 mg | Freq: Four times a day (QID) | INTRAVENOUS | Status: DC | PRN
  Administered 2012-08-10: 500 mg via INTRAVENOUS
  Filled 2012-08-10: qty 5

## 2012-08-10 MED ORDER — METOCLOPRAMIDE HCL 10 MG PO TABS: 5.0000 mg | ORAL_TABLET | Freq: Three times a day (TID) | ORAL | Status: DC | PRN

## 2012-08-10 MED ORDER — ROCURONIUM BROMIDE 100 MG/10ML IV SOLN
INTRAVENOUS | Status: DC | PRN
  Administered 2012-08-10: 30 mg via INTRAVENOUS

## 2012-08-10 MED ORDER — ONDANSETRON HCL 4 MG/2ML IJ SOLN: 4.0000 mg | Freq: Four times a day (QID) | INTRAMUSCULAR | Status: DC | PRN

## 2012-08-10 MED ORDER — SENNOSIDES-DOCUSATE SODIUM 8.6-50 MG PO TABS
1.0000 | ORAL_TABLET | Freq: Every evening | ORAL | Status: DC | PRN
  Filled 2012-08-10: qty 1

## 2012-08-10 MED ORDER — RISPERIDONE 0.5 MG PO TABS
0.5000 mg | ORAL_TABLET | Freq: Two times a day (BID) | ORAL | Status: DC
  Administered 2012-08-11 (×2): 0.5 mg via ORAL
  Filled 2012-08-10 (×4): qty 1

## 2012-08-10 MED ORDER — DOCUSATE SODIUM 100 MG PO CAPS
100.0000 mg | ORAL_CAPSULE | Freq: Two times a day (BID) | ORAL | Status: DC
  Administered 2012-08-10 – 2012-08-13 (×6): 100 mg via ORAL

## 2012-08-10 MED ORDER — SUCCINYLCHOLINE CHLORIDE 20 MG/ML IJ SOLN
INTRAMUSCULAR | Status: DC | PRN
  Administered 2012-08-10: 100 mg via INTRAVENOUS

## 2012-08-10 MED ORDER — CEFAZOLIN SODIUM-DEXTROSE 2-3 GM-% IV SOLR
2.0000 g | Freq: Four times a day (QID) | INTRAVENOUS | Status: AC
  Administered 2012-08-10 – 2012-08-11 (×3): 2 g via INTRAVENOUS
  Filled 2012-08-10 (×3): qty 50

## 2012-08-10 MED ORDER — KCL IN DEXTROSE-NACL 20-5-0.45 MEQ/L-%-% IV SOLN
INTRAVENOUS | Status: DC
  Administered 2012-08-10 – 2012-08-11 (×2): via INTRAVENOUS
  Filled 2012-08-10 (×3): qty 1000

## 2012-08-10 MED ORDER — PROPOFOL 10 MG/ML IV EMUL
INTRAVENOUS | Status: DC | PRN
  Administered 2012-08-10: 150 mg via INTRAVENOUS

## 2012-08-10 MED ORDER — NEOSTIGMINE METHYLSULFATE 1 MG/ML IJ SOLN
INTRAMUSCULAR | Status: DC | PRN
  Administered 2012-08-10: 5 mg via INTRAVENOUS

## 2012-08-10 MED ORDER — LIDOCAINE HCL (CARDIAC) 20 MG/ML IV SOLN
INTRAVENOUS | Status: DC | PRN
  Administered 2012-08-10: 60 mg via INTRAVENOUS

## 2012-08-10 MED ORDER — MORPHINE SULFATE (PF) 1 MG/ML IV SOLN
INTRAVENOUS | Status: AC
  Administered 2012-08-10: 1 mg
  Filled 2012-08-10: qty 25

## 2012-08-10 MED ORDER — ONDANSETRON HCL 4 MG PO TABS: 4.0000 mg | ORAL_TABLET | Freq: Four times a day (QID) | ORAL | Status: DC | PRN

## 2012-08-10 MED ORDER — LIDOCAINE HCL 2 % EX GEL
CUTANEOUS | Status: AC
  Filled 2012-08-10: qty 10

## 2012-08-10 MED ORDER — CEFAZOLIN SODIUM-DEXTROSE 2-3 GM-% IV SOLR
INTRAVENOUS | Status: AC
  Filled 2012-08-10: qty 50

## 2012-08-10 MED ORDER — LACTATED RINGERS IV SOLN
INTRAVENOUS | Status: DC | PRN
  Administered 2012-08-10: 10:00:00 via INTRAVENOUS

## 2012-08-10 MED ORDER — ONDANSETRON HCL 4 MG/2ML IJ SOLN
INTRAMUSCULAR | Status: DC | PRN
  Administered 2012-08-10: 4 mg via INTRAVENOUS

## 2012-08-10 MED ORDER — DIPHENHYDRAMINE HCL 12.5 MG/5ML PO ELIX: 12.5000 mg | ORAL_SOLUTION | ORAL | Status: DC | PRN

## 2012-08-10 MED ORDER — FENTANYL CITRATE 0.05 MG/ML IJ SOLN: 25.0000 ug | INTRAMUSCULAR | Status: DC | PRN

## 2012-08-10 MED ORDER — CEFAZOLIN SODIUM-DEXTROSE 2-3 GM-% IV SOLR
INTRAVENOUS | Status: DC | PRN
  Administered 2012-08-10: 2 g via INTRAVENOUS

## 2012-08-10 MED ORDER — FLEET ENEMA 7-19 GM/118ML RE ENEM: 1.0000 | ENEMA | Freq: Once | RECTAL | Status: AC | PRN

## 2012-08-10 MED ORDER — BUPIVACAINE HCL (PF) 0.25 % IJ SOLN
INTRAMUSCULAR | Status: DC | PRN
  Administered 2012-08-10: 30 mL

## 2012-08-10 MED ORDER — HYDROCODONE-ACETAMINOPHEN 5-325 MG PO TABS
1.0000 | ORAL_TABLET | ORAL | Status: DC | PRN
  Administered 2012-08-12: 1 via ORAL
  Filled 2012-08-10: qty 1

## 2012-08-10 MED ORDER — MORPHINE SULFATE (PF) 1 MG/ML IV SOLN
INTRAVENOUS | Status: DC
  Administered 2012-08-10: 4.5 mg via INTRAVENOUS
  Administered 2012-08-10: via INTRAVENOUS
  Administered 2012-08-11: 15 mg via INTRAVENOUS
  Filled 2012-08-10: qty 25

## 2012-08-10 MED ORDER — DIPHENHYDRAMINE HCL 50 MG/ML IJ SOLN: 12.5000 mg | Freq: Four times a day (QID) | INTRAMUSCULAR | Status: DC | PRN

## 2012-08-10 SURGICAL SUPPLY — 77 items
APL SKNCLS STERI-STRIP NONHPOA (GAUZE/BANDAGES/DRESSINGS)
BAG ZIPLOCK 12X15 (MISCELLANEOUS) ×2 IMPLANT
BANDAGE ELASTIC 6 VELCRO ST LF (GAUZE/BANDAGES/DRESSINGS) ×2 IMPLANT
BANDAGE GAUZE ELAST BULKY 4 IN (GAUZE/BANDAGES/DRESSINGS) IMPLANT
BENZOIN TINCTURE PRP APPL 2/3 (GAUZE/BANDAGES/DRESSINGS) IMPLANT
BIT DRILL 2.5X2.75 QC CALB (BIT) ×6 IMPLANT
BIT DRILL CALIBRATED 2.7 (BIT) ×4 IMPLANT
BNDG ESMARK 4X9 LF (GAUZE/BANDAGES/DRESSINGS) ×2 IMPLANT
CATH ROBINSON RED A/P 16FR (CATHETERS) ×2 IMPLANT
CLEANER TIP ELECTROSURG 2X2 (MISCELLANEOUS) ×2 IMPLANT
CLOTH BEACON ORANGE TIMEOUT ST (SAFETY) ×2 IMPLANT
CORDS BIPOLAR (ELECTRODE) IMPLANT
CUFF TOURN SGL QUICK 18 (TOURNIQUET CUFF) ×2 IMPLANT
DRAPE C-ARM 42X72 X-RAY (DRAPES) ×4 IMPLANT
DRAPE INCISE IOBAN 66X45 STRL (DRAPES) IMPLANT
DRAPE LG THREE QUARTER DISP (DRAPES) ×2 IMPLANT
DRAPE ORTHO SPLIT 77X108 STRL (DRAPES) ×2
DRAPE SURG 17X11 SM STRL (DRAPES) IMPLANT
DRAPE SURG ORHT 6 SPLT 77X108 (DRAPES) ×1 IMPLANT
DRAPE U-SHAPE 47X51 STRL (DRAPES) ×2 IMPLANT
DRSG ADAPTIC 3X8 NADH LF (GAUZE/BANDAGES/DRESSINGS) IMPLANT
DRSG EMULSION OIL 3X16 NADH (GAUZE/BANDAGES/DRESSINGS) ×2 IMPLANT
DRSG PAD ABDOMINAL 8X10 ST (GAUZE/BANDAGES/DRESSINGS) IMPLANT
ELECT REM PT RETURN 9FT ADLT (ELECTROSURGICAL) ×2
ELECTRODE REM PT RTRN 9FT ADLT (ELECTROSURGICAL) ×1 IMPLANT
EVACUATOR 1/8 PVC DRAIN (DRAIN) IMPLANT
GLOVE BIO SURGEON STRL SZ7.5 (GLOVE) ×4 IMPLANT
GLOVE BIOGEL PI IND STRL 7.5 (GLOVE) ×1 IMPLANT
GLOVE BIOGEL PI IND STRL 8 (GLOVE) IMPLANT
GLOVE BIOGEL PI INDICATOR 7.5 (GLOVE) ×1
GLOVE BIOGEL PI INDICATOR 8 (GLOVE)
GOWN STRL NON-REIN LRG LVL3 (GOWN DISPOSABLE) IMPLANT
K-WIRE FIXATION 2.0X6 (WIRE) ×6
KIT BASIN OR (CUSTOM PROCEDURE TRAY) ×2 IMPLANT
KWIRE FIXATION 2.0X6 (WIRE) ×3 IMPLANT
LOOP VESSEL MAXI BLUE (MISCELLANEOUS) ×2 IMPLANT
MANIFOLD NEPTUNE II (INSTRUMENTS) ×2 IMPLANT
NEEDLE HYPO 25X1 1.5 SAFETY (NEEDLE) ×2 IMPLANT
NS IRRIG 1000ML POUR BTL (IV SOLUTION) ×2 IMPLANT
PACK SHOULDER CUSTOM OPM052 (CUSTOM PROCEDURE TRAY) ×2 IMPLANT
PACK TOTAL JOINT (CUSTOM PROCEDURE TRAY) IMPLANT
PAD CAST 4YDX4 CTTN HI CHSV (CAST SUPPLIES) ×1 IMPLANT
PADDING CAST COTTON 4X4 STRL (CAST SUPPLIES) ×1
PLATE LOCK R LG 97X10.9X2.5X10 (Plate) ×1 IMPLANT
PLATE LOCK RT LG 85X10.7X2.4X9 (Plate) ×1 IMPLANT
PLATE LOCK RT LRG (Plate) ×2 IMPLANT
POSITIONER SURGICAL ARM (MISCELLANEOUS) ×2 IMPLANT
SCREW CORT T15 24X3.5XST LCK (Screw) ×3 IMPLANT
SCREW CORT T15 28X3.5XST LCK (Screw) ×1 IMPLANT
SCREW CORTICAL 3.5X24MM (Screw) ×3 IMPLANT
SCREW CORTICAL 3.5X28MM (Screw) ×1 IMPLANT
SCREW LOCK 3.5X48 DIST TIB (Screw) ×2 IMPLANT
SCREW LOCK 3.5X50 DIST TIB (Screw) ×2 IMPLANT
SCREW LOCK 3.5X52 DIST TIB (Screw) ×2 IMPLANT
SCREW LOCK CORT STAR 3.5X10 (Screw) ×2 IMPLANT
SCREW LOCK CORT STAR 3.5X20 (Screw) ×2 IMPLANT
SCREW LOCK CORT STAR 3.5X44 (Screw) ×2 IMPLANT
SCREW LP 3.5X60MM (Screw) ×2 IMPLANT
SCREW LP 3.5X65MM (Screw) ×2 IMPLANT
SLING ARM FOAM STRAP LRG (SOFTGOODS) ×2 IMPLANT
SPLINT PLASTER CAST XFAST 5X30 (CAST SUPPLIES) ×10 IMPLANT
SPLINT PLASTER XFAST SET 5X30 (CAST SUPPLIES) ×10
SPONGE GAUZE 4X4 12PLY (GAUZE/BANDAGES/DRESSINGS) IMPLANT
SPONGE LAP 18X18 X RAY DECT (DISPOSABLE) ×6 IMPLANT
STAPLER VISISTAT 35W (STAPLE) ×2 IMPLANT
STOCKINETTE 8 INCH (MISCELLANEOUS) IMPLANT
SUCTION FRAZIER TIP 10 FR DISP (SUCTIONS) ×2 IMPLANT
SUT ETHIBOND 5 LR DA (SUTURE) IMPLANT
SUT VIC AB 0 CT1 36 (SUTURE) ×4 IMPLANT
SUT VIC AB 2-0 CT1 27 (SUTURE) ×2
SUT VIC AB 2-0 CT1 TAPERPNT 27 (SUTURE) ×2 IMPLANT
SYR 5ML LL (SYRINGE) IMPLANT
SYR CONTROL 10ML LL (SYRINGE) ×2 IMPLANT
TOWEL OR 17X26 10 PK STRL BLUE (TOWEL DISPOSABLE) ×4 IMPLANT
TRAY FOLEY BAG SILVER LF 14FR (CATHETERS) IMPLANT
WASHER 3.5MM (Orthopedic Implant) ×4 IMPLANT
WATER STERILE IRR 1500ML POUR (IV SOLUTION) IMPLANT

## 2012-08-10 NOTE — Anesthesia Preprocedure Evaluation (Addendum)
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 Prolonged NICU stay due premature delivery as child. negative neurological ROS  negative psych ROS   GI/Hepatic negative GI ROS, Neg liver ROS,   Endo/Other  Morbid obesity  Renal/GU negative Renal ROS  negative genitourinary   Musculoskeletal negative musculoskeletal ROS (+)   Abdominal   Peds  (+) premature delivery, NICU stay and ventilator requiredNeurological problem Hematology negative hematology ROS (+)   Anesthesia Other Findings   Reproductive/Obstetrics negative OB ROS                          Anesthesia Physical Anesthesia Plan  ASA: III  Anesthesia Plan: General   Post-op Pain Management:    Induction: Intravenous  Airway Management Planned: Oral ETT  Additional Equipment:   Intra-op Plan:   Post-operative Plan: Extubation in OR  Informed Consent: I have reviewed the patients History and Physical, chart, labs and discussed the procedure including the risks, benefits and alternatives for the proposed anesthesia with the patient or authorized representative who has indicated his/her understanding and acceptance.   Dental advisory given  Plan Discussed with: CRNA  Anesthesia Plan Comments:         Anesthesia Quick Evaluation
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
## 2012-08-10 NOTE — [facility] Counselor (Signed)
Patient referred to [facility] for inpatient treatment this am. Received call from [facility] at [facility] stating that he called patient's nurse and was told that patient was going to the medical floor. Writer contacted patient's nurse to verify that patient was going to the medial floor. She explained that patient was scheduled for surgery today. Writer asked if patient was returning to the ED after surgery. Patient's nurse sts that patient will probably remain on the medical floor for recovery. Writer will take patient off current list of patients pending dispositions. SW/Psychiatry will follow up with patient once his surgery has been completed.

## 2012-08-10 NOTE — Preoperative (Signed)
Beta Blockers   Reason not to administer Beta Blockers:Not Applicable 

## 2012-08-10 NOTE — ED Notes (Addendum)
Patient transported to OR by stretcher with or tech. Pt alert, no changes, no acute distress

## 2012-08-10 NOTE — Transfer of Care (Signed)
Immediate Anesthesia Transfer of Care Note  Patient: Hunter [name]  Procedure(s) Performed: Procedure(s) (LRB): OPEN REDUCTION INTERNAL FIXATION (ORIF) DISTAL HUMERUS FRACTURE (Right)  Patient Location: PACU  Anesthesia Type: General  Level of Consciousness: awake, alert , oriented and patient cooperative  Airway & Oxygen Therapy: Patient Spontanous Breathing and Patient connected to face mask oxygen  Post-op Assessment: Report given to PACU RN, Post -op Vital signs reviewed and stable and Patient moving all extremities  Post vital signs: Reviewed and stable  Complications: No apparent anesthesia complications
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
## 2012-08-10 NOTE — Interval H&P Note (Signed)
History and Physical Interval Note:  08/10/2012 9:38 AM  Hunter [name]  has presented today for surgery, with the diagnosis of distal humerus fracture on right  The various methods of treatment have been discussed with the patient and family. After consideration of risks, benefits and other options for treatment, the patient has consented to  Procedure(s) (LRB): OPEN REDUCTION INTERNAL FIXATION (ORIF) DISTAL HUMERUS FRACTURE (Right) as a surgical intervention .  The patient's history has been reviewed, patient examined, no change in status, stable for surgery.  I have reviewed the patient's chart and labs.  Questions were answered to the patient's satisfaction.     Mable Paris

## 2012-08-10 NOTE — Anesthesia Postprocedure Evaluation (Signed)
Anesthesia Post Note  Patient: Hunter [name]  Procedure(s) Performed: Procedure(s) (LRB): OPEN REDUCTION INTERNAL FIXATION (ORIF) DISTAL HUMERUS FRACTURE (Right)  Anesthesia type: General  Patient location: PACU  Post pain: Pain level controlled  Post assessment: Post-op Vital signs reviewed  Last Vitals:  Filed Vitals:   08/10/12 1500  BP: 164/71  Pulse: 123  Temp: 37 C  Resp:     Post vital signs: Reviewed  Level of consciousness: sedated  Complications: No apparent anesthesia complications

## 2012-08-11 LAB — CBC
HCT: 36.1 % — ABNORMAL LOW (ref 39.0–52.0)
Hemoglobin: 12 g/dL — ABNORMAL LOW (ref 13.0–17.0)
MCHC: 33.2 g/dL (ref 30.0–36.0)
MCV: 96.3 fL (ref 78.0–100.0)

## 2012-08-11 LAB — BASIC METABOLIC PANEL
BUN: 7 mg/dL (ref 6–23)
CO2: 29 mEq/L (ref 19–32)
Chloride: 99 mEq/L (ref 96–112)
GFR calc Af Amer: >90 mL/min (ref >90)
Potassium: 5.2 mEq/L — ABNORMAL HIGH (ref 3.5–5.1)

## 2012-08-11 MED ORDER — OXYCODONE-ACETAMINOPHEN 5-325 MG PO TABS: 1.0000 | ORAL_TABLET | ORAL | Status: DC | PRN

## 2012-08-11 NOTE — Progress Notes (Signed)
PATIENT ID: Hunter [name]   1 Day Post-Op Procedure(s) (LRB): OPEN REDUCTION INTERNAL FIXATION (ORIF) DISTAL HUMERUS FRACTURE (Right)  Subjective: Doing well. Very tired. Pain is 6/10. Per nursing, was in and out cathed 2x last night.  Objective:  Filed Vitals:   08/11/12 0528  BP: 130/74  Pulse: 118  Temp: 98.5 F (36.9 C)  Resp: 12     Awake, responsive to questions but very sleepy.  Dressing clean, dry and intact. Decreased sensation to light touch of ulnar distribution in hand.  Weakness in hand intrinsics. Tachycardic  Labs:  No results found for this basename: HGB:5 in the last 72 hoursNo results found for this basename: WBC:2,RBC:2,HCT:2,PLT:2 in the last 72 hoursNo results found for this basename: NA:2,K:2,CL:2,CO2:2,BUN:2,CREATININE:2,GLUCOSE:2,CALCIUM:2 in the last 72 hours  Assessment and Plan: 1 day PO ORIF Right distal humerus fracture Ulnar nerve neuropraxia, expected Will d/c PCA and IV fluids Will get out of bed with nursing Behavioral health will see him to AM to rec rx Transfer to behavioral health today if beds available Tachycardic, will get 12 lead EKG, CBC, BMP  VTE proph: SCDs

## 2012-08-11 NOTE — Progress Notes (Signed)
   CARE MANAGEMENT NOTE 08/11/2012  Patient:  Hunter [name]   Account Number:  [id number]  Date Initiated:  08/11/2012  Documentation initiated by:  Hunter [name]  Subjective/Objective Assessment:   Patient was admitted for placement at an Inpatient Psych facility- holding in the ED psych dept; to surgery 08/10/2012 for OPEN REDUCTION INTERNAL FIXATION (ORIF) RIGHT SUPRACONDYLAR HUMERUS FRACTURE     Action/Plan:   independent prior to admission; soc worker following for placement/ [facility]   Anticipated DC Date:  08/13/2012   Anticipated DC Plan:  [facility]AT[facility]IC [facility]OS[facility]ITAL  In-house referral  Clinical Social Worker      DC Planning Services  CM consult            Status of service:  In process, will continue to follow Medicare Important Message given?  NA - LOS <3 / Initial given by admissions (If response is "NO", the following Medicare IM given date fields will be blank)  Per UR Regulation:  Reviewed for med. necessity/level of care/duration of stay  Comments:  08/11/2012- Hunter [name], BSN, Hunter [name]
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
## 2012-08-12 LAB — BASIC METABOLIC PANEL
BUN: 8 mg/dL (ref 6–23)
Chloride: 103 mEq/L (ref 96–112)
GFR calc non Af Amer: >90 mL/min (ref >90)
Glucose, Bld: 119 mg/dL — ABNORMAL HIGH (ref 70–99)
Potassium: 4.6 mEq/L (ref 3.5–5.1)

## 2012-08-12 MED ORDER — RISPERIDONE 1 MG PO TABS
1.0000 mg | ORAL_TABLET | Freq: Two times a day (BID) | ORAL | Status: DC
  Administered 2012-08-12 – 2012-08-13 (×3): 1 mg via ORAL
  Filled 2012-08-12 (×4): qty 1

## 2012-08-12 MED ORDER — TRAZODONE HCL 50 MG PO TABS
50.0000 mg | ORAL_TABLET | Freq: Every evening | ORAL | Status: DC | PRN
  Filled 2012-08-12: qty 1

## 2012-08-12 MED ORDER — BENZTROPINE MESYLATE 0.5 MG PO TABS
0.5000 mg | ORAL_TABLET | Freq: Two times a day (BID) | ORAL | Status: DC | PRN
  Filled 2012-08-12: qty 1

## 2012-08-12 MED ORDER — BUSPIRONE HCL 10 MG PO TABS
10.0000 mg | ORAL_TABLET | Freq: Two times a day (BID) | ORAL | Status: DC
  Administered 2012-08-12 – 2012-08-13 (×3): 10 mg via ORAL
  Filled 2012-08-12 (×4): qty 1

## 2012-08-12 NOTE — Progress Notes (Signed)
This Clinical research associate spoke with Hunter [name] who reports that Hunter [name] required catheterization x 2 during the night. He remains tachycardic with Pulse 127 at 0625, BP 126/71. Continues on oxygen @  2 LPM with O2 sat. of 94% at 0625.  Left phone message for Hunter [name].  Reviewed Hunter [name] consult from 08/11/12  and will follow up with [facility] provider  regarding her recommendation and clinical update. Doristine Locks RN [facility] A/C at [facility]

## 2012-08-12 NOTE — Progress Notes (Addendum)
CSW met with Pt and psych MD re: transfer to [facility].  Pt reports that he's been to inpt tx at [facility] and that it was the "scariest time" of his life.  He states that it felt like a jail and that he wanted to get out and "write."  Pt states that he chose to stop his meds due to not liking the way that they make him feel.  Pt states that he decided to stop to see how he felt and that he noticed that he felt better.  Pt denied any changes in his mood or affect and was confused by the information that was given to psych MD by his mom; he didn't remember grimacing, having word-salad or thought-blocking.  Pt repeatedly stated, "Let's call this what it is" but could not elaborate on his meaning.  Psych MD discussed with Pt the importance of staying on his medication and discussing with his MD the concerns he has about his medication, should he want to stop taking medication.    Ultimately, Pt agreeable to [facility].  Pt wanting access to books, whereupon CSW and psych MD encouraged Pt to reach out to family to provide him with reading material.    CSW thanked Pt for his time.  Message from [place], [facility], stating that Pt is voiding on his own, off of O2 but that is pulse is in the 100s.    Notified Eric at [facility].  Minerva Areola questioning how many times Pt has voided and voiced concerns re: Pt's tachycardia.  Per Bonita Quin, Care Coordinator, Pt has voided 2x--200, first time, 300, second time.  Relayed info to [place] at [facility].  Minerva Areola has questions re: Pt's sats.  Relayed questions to [place].  Bonita Quin to call CSW back with information.  Per Bonita Quin, Pt's sats are 95% on room air.  Notified Eric.  Eric to run Pt and contact CSW with disposition.  Providence Crosby, LCSWA Clinical Social Work [phone]

## 2012-08-12 NOTE — Progress Notes (Signed)
PATIENT ID: Hunter [name]   2 Days Post-Op Procedure(s) (LRB): OPEN REDUCTION INTERNAL FIXATION (ORIF) DISTAL HUMERUS FRACTURE (Right)  Subjective: Patient doing well. Complains of moderate pain in right arm. Is up and ambulating up and down the hall without difficulty. Pt reports difficulty urinating a strong stream and needed a cath 2x last night. Denies chest pain, shortness of breath, fatigue and dizziness.  Objective:  Filed Vitals:   08/12/12 0625  BP: 126/72  Pulse: 127  Temp: 98.8 F (37.1 C)  Resp: 18     A&Ox3 Dressing clean, dry and intact. Sensation to touch in u/r/m nerve distribution Can wiggle all fingers Tachycardic, asymptomatic  Labs:   [facility] 08/11/12 1050  HGB 12.0*   Basename 08/11/12 1050  WBC 10.9*  RBC 3.75*  HCT 36.1*  PLT 254   Basename 08/12/12 0452 08/11/12 0944  NA 136 135  K 4.6 5.2*  CL 103 99  CO2 21 29  BUN 8 7  CREATININE 0.57 0.67  GLUCOSE 119* 141*  CALCIUM 6.7* 8.5    Assessment and Plan: 2 days po ORIF right distal humerus fracture Appropriate function of ulnar nerve Nursing should continue to get him out of bed 3x a day to walk/sit in chair Still retaining fluid, next time he needs cath, please leave it in for bladder rest until am when he will then try to void on his own, if he can, he is ready for transfer to behavioral health Tachycardia- stable and asymptomatic, EKG w/ sinus tachy, K wnl on most recent labs, patient has been tachy since Friday night prior to surgery, could be likely due to pain vs new rx Psych consulted yesterday, we appreciate the recs, ordered rx that were recommended Will transfer to behavioral health once medically stable/once he can successfully void, anticipate today or tomorrow am  VTE proph: SCDs

## 2012-08-12 NOTE — Progress Notes (Addendum)
Progress Note following Consultation:  Hunter [name] pt with Psych  CSW Pt is more alert today.  He is oriented to person place diagnosis but minimizes situation "I was only trying to figure out what I would write next" He says he was at [facility] and another place.  He thought being locked up was [claustraphobic].  He was connected with ACT team and then decided to stop taking his medications "they made me  drool and I could not write".  He minimizes recurrent symptoms his mother Maurice Small visit] recognized and brought him to [facility] ED.  He is agreeable to inpt [facility], if accepted, and does not want to stay long. He says he wants to read.  Then, explains that he does lots of reading on line.  He is particularly  Interested in a DR. ___________'s writings.    DIAGNOSIS:  AXIS I  Schizophrenia, paranoid type   AXIS II  Deferred   AXIS III  See medical notes.   AXIS IV  economic problems, educational problems, occupational problems, other psychosocial or environmental problems and problems related to social environment   AXIS V  51-60 moderate symptoms    Assessment and Plan:  Pt is very guarded.  He does not like to believe he has Schizophrenia "I'm not one of those crazies who goes out and kills people".  He will benefit from psycho-education about his illness and need for medication.  He denies thoughts of suicide/homicide.  There are no bizarre thoughts or behaviors.  He denies AH/VH and does not exhibit thought blocking.  He is paranoid and states he does not like to be around crowds of people.  He has stopped his antipsychotic medication and needs to restart it and be re-connected with ACT team.  RECOMMENDATION:  1.  Pt has capacity and is to be transferred to a psychiatric facility, preferably  [facility]. When medically stable 2.  Suggest taper to discontinue benzodiazepine.  Pt has Buspar for anxiety and needs to preserve cognitive function and memory.  2.  No further psychiatric needs identified.  MD  Psychiatrist signs off Sheryl Towell J. Ferol Luz, MD Psychiatrist  08/12/2012 4:24 PM

## 2012-08-12 NOTE — Progress Notes (Signed)
Per Minerva Areola, Pt has been accepted, however MD wanting Pt to remain in hospital tonight to ensure that Pt continues to void independently and to monitor pulse.  If things remain stable, Pt can be admitted tomorrow.  Notified Bonita Quin, Care Coordinator.  CSW to continue to follow.  Providence Crosby, LCSWA Clinical Social Work [phone]

## 2012-08-13 ENCOUNTER — Encounter (HOSPITAL_COMMUNITY): Payer: Self-pay | Admitting: Intensive Care

## 2012-08-13 ENCOUNTER — Inpatient Hospital Stay (HOSPITAL_COMMUNITY)
Admission: AD | Admit: 2012-08-13 | Discharge: 2012-08-18 | DRG: 885 | Disposition: A | Discharge status: Home / Self Care | Payer: 59 | Source: Ambulatory Visit | Attending: Psychiatry | Admitting: Psychiatry

## 2012-08-13 DIAGNOSIS — F209 Schizophrenia, unspecified: Principal | ICD-10-CM | POA: Diagnosis present

## 2012-08-13 DIAGNOSIS — F200 Paranoid schizophrenia: Secondary | ICD-10-CM | POA: Diagnosis present

## 2012-08-13 DIAGNOSIS — S42309A Unspecified fracture of shaft of humerus, unspecified arm, initial encounter for closed fracture: Secondary | ICD-10-CM

## 2012-08-13 DIAGNOSIS — S42309D Unspecified fracture of shaft of humerus, unspecified arm, subsequent encounter for fracture with routine healing: Secondary | ICD-10-CM

## 2012-08-13 DIAGNOSIS — S42301A Unspecified fracture of shaft of humerus, right arm, initial encounter for closed fracture: Secondary | ICD-10-CM

## 2012-08-13 DIAGNOSIS — F1210 Cannabis abuse, uncomplicated: Secondary | ICD-10-CM | POA: Diagnosis present

## 2012-08-13 DIAGNOSIS — J45909 Unspecified asthma, uncomplicated: Secondary | ICD-10-CM | POA: Diagnosis present

## 2012-08-13 HISTORY — DX: Unspecified fracture of shaft of humerus, right arm, initial encounter for closed fracture: S42.301A

## 2012-08-13 HISTORY — DX: Mental disorder, not otherwise specified: F99

## 2012-08-13 MED ORDER — ALUM & MAG HYDROXIDE-SIMETH 200-200-20 MG/5ML PO SUSP: 30.0000 mL | ORAL | Status: DC | PRN

## 2012-08-13 MED ORDER — RISPERIDONE 0.5 MG PO TABS
0.5000 mg | ORAL_TABLET | ORAL | Status: DC
  Administered 2012-08-13 – 2012-08-14 (×2): 0.5 mg via ORAL
  Filled 2012-08-13 (×7): qty 1

## 2012-08-13 MED ORDER — MAGNESIUM HYDROXIDE 400 MG/5ML PO SUSP: 30.0000 mL | Freq: Every day | ORAL | Status: DC | PRN

## 2012-08-13 MED ORDER — ACETAMINOPHEN 325 MG PO TABS
650.0000 mg | ORAL_TABLET | Freq: Four times a day (QID) | ORAL | Status: DC | PRN
  Administered 2012-08-14 (×2): 650 mg via ORAL
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
## 2012-08-13 NOTE — Tx Team (Signed)
Initial Interdisciplinary Treatment Plan  PATIENT STRENGTHS: (choose at least two) General fund of knowledge Motivation for treatment/growth Supportive family/friends  PATIENT STRESSORS: Educational concerns Financial difficulties Health problems   PROBLEM LIST: Problem List/Patient Goals Date to be addressed Date deferred Reason deferred Estimated date of resolution                                                         DISCHARGE CRITERIA:  Ability to meet basic life and health needs Adequate post-discharge living arrangements Improved stabilization in mood, thinking, and/or behavior Medical problems require only outpatient monitoring Motivation to continue treatment in a less acute level of care Need for constant or close observation no longer present  PRELIMINARY DISCHARGE PLAN: Attend aftercare/continuing care group Attend PHP/IOP Attend 12-step recovery group Outpatient therapy  PATIENT/FAMIILY INVOLVEMENT: This treatment plan has been presented to and reviewed with the patient, [name].  The patient and family have been given the opportunity to ask questions and make suggestions.  Nestor Ramp [facility] 08/13/2012, 3:18 PM

## 2012-08-13 NOTE — Discharge Summary (Signed)
Patient ID: [name] MRN: [id number] DOB/AGE: 04-Apr-1991 21 y.o.  Admit date: 08/04/2012 Discharge date: 08/13/2012  Admission Diagnoses:  Principal Problem:  *Humerus fracture   Discharge Diagnoses:  Same  Past Medical History  Diagnosis Date  . Premature birth   . Retinal detachment   . Reactive airway disease   . Pneumonia   . No pertinent past medical history   . Mental disorder     Surgeries: Procedure(s): OPEN REDUCTION INTERNAL FIXATION (ORIF) DISTAL HUMERUS FRACTURE on 08/04/2012 - August 18, 2012   Consultants:    Discharged Condition: Improved  Hospital Course: [name] is an 21 y.o. male who was admitted 08/04/2012 for operative treatment ofHumerus fracture. Patient sustained a distal humerus fracture in the ED on 08/04/2012 that needed surgical repair. After pre-op clearance the patient was taken to the operating room on 08/04/2012 - 08-18-12 and underwent  Procedure(s): OPEN REDUCTION INTERNAL FIXATION (ORIF) DISTAL HUMERUS FRACTURE.    Patient was given perioperative antibiotics: Anti-infectives     Start     Dose/Rate Route Frequency Ordered Stop   08-18-2012 1630   ceFAZolin (ANCEF) IVPB 2 g/50 mL premix        2 g 100 mL/hr over 30 Minutes Intravenous Every 6 hours 18-Aug-2012 1537 08-19-12 0421           Patient was given sequential compression devices and early ambulation to prevent DVT.  Patient benefited maximally from hospital stay and there were no complications.    Recent vital signs: Patient Vitals for the past 24 hrs:  BP Temp Temp src Pulse Resp SpO2  08/13/12 0632 124/71 mmHg 98.2 F (36.8 C) Oral 117  20  97 %  08/12/12 2300 121/60 mmHg 98.2 F (36.8 C) Oral 119  20  96 %  08/12/12 1341 125/73 mmHg 98.3 F (36.8 C) Oral 100  20  97 %     Recent laboratory studies:  Basename 08/12/12 0452 08-19-2012 1050 Aug 19, 2012 0944  WBC -- 10.9* --  HGB -- 12.0* --  HCT -- 36.1* --  PLT -- 254 --  NA 136 -- 135  K 4.6 -- 5.2*  CL 103 --  99  CO2 21 -- 29  BUN 8 -- 7  CREATININE 0.57 -- 0.67  GLUCOSE 119* -- 141*  INR -- -- --  CALCIUM 6.7* -- --     Discharge Medications:   Medication List  As of 08/13/2012  1:08 PM   TAKE these medications         oxyCODONE-acetaminophen 5-325 MG per tablet   Commonly known as: PERCOCET/ROXICET   Take 1-2 tablets by mouth every 4 (four) hours as needed for pain.      risperiDONE 0.5 MG tablet   Commonly known as: RISPERDAL   Take 0.5 mg by mouth 2 (two) times daily.            Diagnostic Studies: Dg Elbow 2 Views Right  2012/08/18  *RADIOLOGY REPORT*  Clinical Data: Postop elbow fracture repair.  RIGHT ELBOW - 2 VIEW  Comparison: Postoperative films 08/18/12.  Findings: Plate and screw fixation of distal humerus fractures with near anatomic reduction.  No complicating features.  IMPRESSION: Internal fixation of distal humeral fractures.   Original Report Authenticated By: P. Loralie Champagne, M.D.    Dg Elbow 2 Views Right  2012/08/18  *RADIOLOGY REPORT*  Clinical Data: ORIF of distal humeral fracture.  RIGHT ELBOW - 2 VIEW  Comparison: Radiographs 08/04/2012.  Findings: Six spot fluoroscopic images demonstrate  open reduction and internal fixation of the oblique supracondylar humeral fracture with medial and lateral plates and screws.  There is near anatomic reduction of the fracture.  The preexisting diaphyseal plate and screws appear unchanged.  There is no evidence of dislocation at the elbow.  IMPRESSION: Improved alignment of supracondylar fracture status post ORIF.   Original Report Authenticated By: Gerrianne Scale, M.D.    Dg Humerus Right  08/10/2012  *RADIOLOGY REPORT*  Clinical Data: Distal humerus fracture.  RIGHT HUMERUS - 2+ VIEW  Comparison: Radiographs dated 08/04/2012 and intraoperative images dated 08/10/2012  Findings: The patient has undergone open reduction and internal fixation of the distal humeral shaft fracture.  Alignment and position is near anatomic.   To side plates and four screws have been inserted the distal humerus.  The old well healed fracture of the mid humeral shaft is noted with a side plate and multiple screws.  IMPRESSION: Open reduction and internal fixation of distal humeral fracture as described.   Original Report Authenticated By: Gwynn Burly, M.D.    Dg Humerus Right  08/04/2012  *RADIOLOGY REPORT*  Clinical Data: Injured right arm with pain distally  RIGHT HUMERUS - 2+ VIEW  Comparison: Right humerus films of 02/25/2010 and C-arm spot films of the right humerus of 02/26/2010  Findings:  The plate and screw fixation of the prior mid right humeral fracture is in good position with no significant abnormality.  However, there is an acute fracture of the distal right humerus just distal to the fixation plate.  There is slight angulation of bone fragments at this fracture site.  IMPRESSION: Acute angulated fracture of the distal right humerus just below the fixation plate.   Original Report Authenticated By: Juline Patch, M.D.    Dg C-arm Gt 120 Min-no Report  08/10/2012  CLINICAL DATA: surgery   C-ARM GT 120 MINUTE  Fluoroscopy was utilized by the requesting physician.  No radiographic  interpretation.      [facility]  Discharge Orders    Future Orders Please Complete By Expires   Diet - low sodium heart healthy      Call MD / Call 911      Comments:   If you experience chest pain or shortness of breath, CALL 911 and be transported to the hospital emergency room.  If you develope a fever above 101 F, pus (white drainage) or increased drainage or redness at the wound, or calf pain, call your surgeon's office.   Constipation Prevention      Comments:   Drink plenty of fluids.  Prune juice may be helpful.  You may use a stool softener, such as Colace (over the counter) 100 mg twice a day.  Use MiraLax (over the counter) for constipation as needed.   Increase activity slowly as tolerated          Follow-up Information    Follow up with Mable Paris, MD. Schedule an appointment as soon as possible for a visit in 2 weeks.   Contact information:   [facility] [address] [place] [address] [phone]           Signed: Jiles Harold 08/13/2012, 1:08 PM

## 2012-08-13 NOTE — Progress Notes (Signed)
[facility] Group Notes:  (Counselor/Nursing/MHT/Case Management/Adjunct)  08/13/2012 8:59 PM  Type of Therapy:  Psychoeducational Skills  Participation Level:  Minimal  Participation Quality:  Inattentive  Affect:  Depressed  Cognitive:  Appropriate  Insight:  Good  Engagement in Group:  Limited  Engagement in Therapy:  Limited  Modes of Intervention:  Education  Summary of Progress/Problems: The patient stated that he had an okay day, but he would not explain any further. His goal for tomorrow "get rest" and to "get up". He did not attend groups and so he hopes to attend one tomorrow.    Issak Goley S 08/13/2012, 8:59 PM

## 2012-08-13 NOTE — Progress Notes (Signed)
PATIENT ID: [name]   3 Days Post-Op Procedure(s) (LRB): OPEN REDUCTION INTERNAL FIXATION (ORIF) DISTAL HUMERUS FRACTURE (Right)  Subjective: Doing very well. Reports pain in right arm is minimal. Per nursing, was able to void last night and this am without problems. Active and up out of bed last night and this am. Denies chest pain, shortness of breath, lightheadedness and dizziness.   Objective:  Filed Vitals:   08/13/12 0632  BP: 124/71  Pulse: 117  Temp: 98.2 F (36.8 C)  Resp: 20     A&ox3 Splint and dressing c/d/i Reports some decreased sensation to light touch in the ulnar nerve distribution Can wiggle all fingers  Labs:   Basename 08/11/12 1050  HGB 12.0*   Basename 08/11/12 1050  WBC 10.9*  RBC 3.75*  HCT 36.1*  PLT 254   Basename 08/12/12 0452 08/11/12 0944  NA 136 135  K 4.6 5.2*  CL 103 99  CO2 21 29  BUN 8 7  CREATININE 0.57 0.67  GLUCOSE 119* 141*  CALCIUM 6.7* 8.5    Assessment and Plan: 3 days po ORIF right distal humerus fracture Ulnar nerve neuropraxia, expected Continue out of bed with nursing Tachycardic, asymptomatic and trending down Transfer to behavioral health today Percocet 5-325 1-2 po q 4-6hrs for pain, fu with Dr. Ave Filter in 1-2 weeks     VTE proph: SCDs

## 2012-08-13 NOTE — Progress Notes (Signed)
Pt to transfer voluntarily to [facility] today. [facility] accepting MD is Dr. Darlina Guys. Pt will go to room 402 bed 1. CSW signing off as no other CSW needs identified at this time.  Dellie Burns, MSW, LCSWA [phone] (coverage)

## 2012-08-14 DIAGNOSIS — F200 Paranoid schizophrenia: Secondary | ICD-10-CM

## 2012-08-14 DIAGNOSIS — F1210 Cannabis abuse, uncomplicated: Secondary | ICD-10-CM

## 2012-08-14 MED ORDER — ACETAMINOPHEN 500 MG PO TABS
1000.0000 mg | ORAL_TABLET | Freq: Four times a day (QID) | ORAL | Status: DC | PRN
  Administered 2012-08-14 – 2012-08-17 (×3): 1000 mg via ORAL
  Filled 2012-08-14 (×3): qty 2

## 2012-08-14 MED ORDER — RISPERIDONE 1 MG PO TABS
1.0000 mg | ORAL_TABLET | ORAL | Status: DC
  Administered 2012-08-14 – 2012-08-18 (×8): 1 mg via ORAL
  Filled 2012-08-14 (×8): qty 1
  Filled 2012-08-14: qty 28
  Filled 2012-08-14 (×3): qty 1
  Filled 2012-08-14: qty 28

## 2012-08-14 MED ORDER — BENZTROPINE MESYLATE 0.5 MG PO TABS
0.5000 mg | ORAL_TABLET | Freq: Two times a day (BID) | ORAL | Status: DC
  Administered 2012-08-14 – 2012-08-16 (×4): 0.5 mg via ORAL
  Filled 2012-08-14 (×9): qty 1

## 2012-08-14 NOTE — Progress Notes (Signed)
Psychoeducational Group Note  Date:  08/14/2012 Time:  1015  Group Topic/Focus:  Identifying Needs:   The focus of this group is to help patients identify their personal needs that have been historically problematic and identify healthy behaviors to address their needs.  Participation Level: Did Not Attend  Participation Quality:  Not Applicable  Affect:  Not Applicable  Cognitive:  Not Applicable  Insight:  Not Applicable  Engagement in Group: Not Applicable  Additional Comments:    Rich Brave 08/14/2012, 11:14 AM

## 2012-08-14 NOTE — Progress Notes (Signed)
D   Pt is calm and quiet  He is soft spoken  He appears somewhat depressed and sad   Pt has a soft cast on his right arm the entire length  He complains of pain and said he only takes tylenol for same   Pt requested to be able to take a shower  A   Verbal support given   Medications administered and effectiveness monitored  Pt on 1 to 1 for safety  Pt offered to have bath in tub room R  Pt safe at present
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
## 2012-08-14 NOTE — Progress Notes (Signed)
Patient ID: Hunter [name], male   DOB: 1991/08/23, 21 y.o.   MRN: [id number]  Pt remains on 1:1 observation for safety. Pt pleasant and calm; no inappropriate behaviors noted. Pt in no acute distress, smiles on approach. Pt interacting well with staff.

## 2012-08-14 NOTE — Progress Notes (Signed)
D   Pt received assistance with bathing and has been calm and appropriate  He is lethargic and somewhat unsteady on his feet  A   Pt on 1 to 1 for safety R   Pt safe at present

## 2012-08-14 NOTE — Discharge Summary (Signed)
Patient kept for additional day due to urinary retention.  Voiding spontaneously at discharge to behavioral health.

## 2012-08-14 NOTE — Progress Notes (Signed)
D   Pt has been calm and cooperative  He is pleasant  Still sleepy and unsteady but is easily redirected  A   Pt on 1 to 1 R   Safe at present

## 2012-08-14 NOTE — Progress Notes (Signed)
Psychoeducational Group Note  Date:  08/14/2012 Time:  1515  Group Topic/Focus:  Healthy Communication:   The focus of this group is to discuss communication, barriers to communication, as well as healthy ways to communicate with others.  Participation Level:  Active  Participation Quality:  Appropriate  Affect:  Appropriate  Cognitive:  Appropriate  Insight:  Good  Engagement in Group:  Good  Additional Comments:  Pt participated and processed in group.  Marquis Lunch, Sabastion Hrdlicka 08/14/2012, 6:51 PM

## 2012-08-14 NOTE — Progress Notes (Signed)
Patient ID: Hunter [name], male   DOB: [birth year], 21 y.o.   MRN: [id number]  Problem: Schizophrenia, paranoid type  D: Pt out in milieu, interacting well with staff, but minimally with peers. Pt on 1:1 observation for safety.  A: Continue to monitor patient on 1:1 basis per sitter; encourage staff/peer interaction. Administer medications as ordered by MD.  R: Pt attended group session, but with minimal interaction. Pt compliant with medications and denies SI or plans to harm himself at this time.
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
## 2012-08-15 NOTE — Progress Notes (Signed)
Patient ID: Hunter [name], male   DOB: July 08, 1991, 21 y.o.   MRN: [id number]   [facility] Group Notes:  (Counselor/Nursing/MHT/Case Management/Adjunct)  08/15/2012 11 AM  Type of Therapy:  Aftercare Planning, Group Therapy, Dance/Movement Therapy   Participation Level:  Minimal  Participation Quality:  Drowsy  Affect:  Flat  Cognitive:  Appropriate  Insight:  Limited  Engagement in Group:  Limited  Engagement in Therapy:  Limited  Modes of Intervention:  Clarification, Problem-solving, Role-play, Socialization and Support  Summary of Progress/Problems: After Care: Pt. attended and participated in aftercare planning group. Pt. accepted information on suicide prevention, warning signs to look for with suicide and crisis line numbers to use. The pt. agreed to call crisis line numbers if having warning signs or having thoughts of suicide. Pt. listed their current anxiety level as high.  Pt. Indicated that he was tired and is hearing voices. Counseling:  Pt spoke about his main support as his family.  [facility] 08/15/2012. 11:51 AM

## 2012-08-15 NOTE — Progress Notes (Signed)
Pt went to the cafeteria and began to throw up so he was brought back to the unit and given gingerale to sip on  He is a bit lethargic and unsteady  Pt on 1 to 1 and is safe at present

## 2012-08-15 NOTE — Progress Notes (Signed)
Pt continues to be restless, tossing and turning frequently in bed. Up to the bathroom frequently with staff assist. 1:1 obs continues for safety. Pt remains safe. Hunter [name]

## 2012-08-15 NOTE — Progress Notes (Signed)
D   Pt approached rn and asked for something for racing thoughts   He said he wasn't irritable or the thoughts were not bad thoughts  Just they were frequent and constantly coming   Asked pt if he was bored and he just smiled and nodded  Pt continues to have an unsteady gait but is less lethargic than yesterday A   Encouraged pt to go to the cafeteria and go outside to break the boredom  Offered other activities  Verbal support given  Medications administered and effectiveness monitored   Q 15 min checks  Pt on 1 to 1 R   Pt safe at present

## 2012-08-15 NOTE — Progress Notes (Signed)
Writer entered patients room and observed patient up in the bathroom washing his hands. Patient has reieved his scheduled hs medication and is preparing for bed. Patient currently denies pain, - si/hi/a/v hall.  1:1 continues for patients safety, patient reports he still feels unsteady on his feet. Safety maintained on unit, will continue to monitor.
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
## 2012-08-15 NOTE — Progress Notes (Signed)
Psychoeducational Group Note  Date:  08/15/2012 Time:  0945 am  Group Topic/Focus:  Making Healthy Choices:   The focus of this group is to help patients identify negative/unhealthy choices they were using prior to admission and identify positive/healthier coping strategies to replace them upon discharge.  Participation Level:  Did Not Attend   Cailie Bosshart J 08/15/2012, 10:29 AM  

## 2012-08-15 NOTE — Progress Notes (Signed)
(  Late entry for 08/14/12 note @2100 ) Pt assessed by this Clinical research associate. He is lying in bed, sedated and lethargic. He is able to answer questions though info forwarded is minimal. He denies feeling any SI/HI and when asked about AVH pt responds with a no that suggests he never has internal stimuli. Pt given reassurance and support. Educated about the remainder of the evening, when he can have next prn pain med as well as scheduled bedtime meds. Pt verbalizes understanding. He remains safe with 1:1 sitter at bedside. Lawrence Marseilles  (Late entry for 08/15/12 @0100 ) Pt assisted by this RN up to bathroom. Pt remains unsteady on his feet, sleepy, lethargic however pt unable to sleep. Pt restless in bed, changing positions frequently. Pt given support, bedrails up x 2 with pillows supporting arm. 1:1 obs remains in place for safety. Pt is safe. Lawrence Marseilles

## 2012-08-15 NOTE — Progress Notes (Signed)
D   Pt is unsteady on his feet  He has a cast on his arm  He does not admit to pain at this time  Pt does report feeling tired and sleepy and when discussed his sleeplessness night he said that was normal for him and he does that at home A   Verbal support given  Medications administered and effectiveness monitored  Pt on 1 to 1 for safety R   Pt safe at present

## 2012-08-15 NOTE — Progress Notes (Signed)
[facility] Group Notes:  (Counselor/Nursing/MHT/Case Management/Adjunct)  08/15/2012 12:07 AM  Type of Therapy:  Psychoeducational Skills  Participation Level:  None  Participation Quality:  Inattentive  Affect:  Depressed  Cognitive:  Alert  Insight:  None  Engagement in Group:  None  Engagement in Therapy:  None  Modes of Intervention:  Education  Summary of Progress/Problems: The patient sat in group for a few minutes and then walked out.    Hunter [name] 08/15/2012, 12:07 AM

## 2012-08-16 MED ORDER — DIPHENHYDRAMINE HCL 50 MG PO CAPS
50.0000 mg | ORAL_CAPSULE | Freq: Three times a day (TID) | ORAL | Status: DC | PRN
  Administered 2012-08-16 – 2012-08-17 (×3): 50 mg via ORAL
  Filled 2012-08-16: qty 21
  Filled 2012-08-16 (×2): qty 1

## 2012-08-16 MED ORDER — BENZTROPINE MESYLATE 0.5 MG PO TABS
0.5000 mg | ORAL_TABLET | ORAL | Status: DC
  Administered 2012-08-16 – 2012-08-18 (×4): 0.5 mg via ORAL
  Filled 2012-08-16: qty 28
  Filled 2012-08-16 (×2): qty 1
  Filled 2012-08-16: qty 28
  Filled 2012-08-16 (×4): qty 1

## 2012-08-16 NOTE — Progress Notes (Signed)
Psychoeducational Group Note  Date:  08/16/2012 Time:  1100  Group Topic/Focus:  Self Care:   The focus of this group is to help patients understand the importance of self-care in order to improve or restore emotional, physical, spiritual, interpersonal, and financial health.  Participation Level:  Active  Participation Quality:  Appropriate and Attentive  Affect:  Appropriate  Cognitive:  Appropriate  Insight:  Good  Engagement in Group:  Good  Additional Comments:  Pt participated in processed in group.  Marquis Lunch, Lyndon Chapel 08/16/2012, 1:02 PM

## 2012-08-16 NOTE — Progress Notes (Signed)
D   Pt is lethargic and unsteady on his feet  He is alert and oriented  He denies suicidal and homicidal ideation A   Pt on one to one R   Safe at present

## 2012-08-16 NOTE — Progress Notes (Signed)
Patient ID: Hunter [name], male   DOB: 02-15-1991, 21 y.o.   MRN: [id number] D-Patient requested medication to sleep last night and appeared to sleep less than 3 hours.  He reports his energy level is normal and his ability to pay attention is improving.  He had a headache this am and his arm was hurting, but tylenol relieved both. Patient was able to take a bath this am and reports he feels better.  He is requesting that arm warp be redone and he is c/o arm itching under cast.  A-He continues on 1:1 for safety.  R- He is in dayroom with peers and participates in program.
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
## 2012-08-16 NOTE — Progress Notes (Signed)
[facility] Group Notes:  (Counselor/Nursing/MHT/Case Management/Adjunct)  08/16/2012 12:23 AM  Type of Therapy:  Psychoeducational Skills  Participation Level:  Minimal  Participation Quality:  Drowsy  Affect:  Blunted  Cognitive:  Appropriate  Insight:  Good  Engagement in Group:  Limited  Engagement in Therapy:  Limited  Modes of Intervention:  Education  Summary of Progress/Problems: We discussed in group this evening what each patient learned today. The patient stated that he learned today that he has to be flexible. His goal for tomorrow is to work on gaining control of his life.    Hazle Coca S 08/16/2012, 12:23 AM

## 2012-08-16 NOTE — Progress Notes (Signed)
Late entry 1:1 note- Writer entered patients room and observed him awake sitting on the side of his bed. Patient reports he normally can't sleep at night and that he does not take medication for sleep. Patient voiced no complaints and was offered a snack which he accepted. 1:1 continues and patient remains safe with MHT at patients bedside. Will continue to monitor.

## 2012-08-16 NOTE — Progress Notes (Signed)
Writer observed patient sitting in the dayroom watching the news. Patient reports that he slept a little last night and feels rested. Patient request to take a bath today and inquired as to when the doctor would be checking his cast. Patient voiced no complaints, denies having pain, -si/hi/a/v hallucinations. Patient reports that he is tired of feeling helpless but did not elaborate on this thought. Patient remains safe with MHT by patients side, 1:1 continues and patient is safe.

## 2012-08-16 NOTE — Progress Notes (Signed)
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  Cannabis Abuse.   The patient was seen today and reports the following:   ADL's: Intact.  Sleep: The patient reports to having difficulty sleeping last night and according to staff he slept just 2 hours last night. Appetite: The patient reports a good appetite today.   Mild>(1-10) >Severe  Hopelessness (1-10): 1-2  Depression (1-10): 0  Anxiety (1-10): 3  Pain (1-10): 2  Suicidal Ideation: The patient denies any suicidal ideations today.  Plan: No  Intent: No  Means: No   Homicidal Ideation: The patient denies any homicidal ideations today.  Plan: No  Intent: No.  Means: No   General Appearance/Behavior: The patient was friendly and cooperative today with this provider.  Eye Contact: Good.  Speech: Appropriate in rate and volume with no pressuring of speech noted today.  Motor Behavior: wnl.  Level of Consciousness: Alert and Oriented x 3.  Mental Status: Alert and Oriented x 3.  Mood: Appears essentially euthymic.  Affect: Appears mildly constricted.  Anxiety Level: Mild anxiety reported today.  Thought Process: wnl.  Thought Content: The patient denies any auditory or visual hallucinations or delusional thinking.  Perception: wnl.  Judgment: Fair to Good.  Insight: Fair to Good.  Cognition: Oriented to person, place and time.  Sleep:  Number of Hours: 2    Vital Signs:Blood pressure 123/81, pulse 114, temperature 97.8 F (36.6 C), temperature source Oral, resp. rate 18, height 5\' 2"  (1.575 m), weight 79.379 kg (175 lb), SpO2 99.00%.  Current Medications: Current Facility-Administered Medications  Medication Dose Route Frequency Provider Last Rate Last Dose  . acetaminophen (TYLENOL) tablet 1,000 mg  1,000 mg Oral Q6H PRN Curlene Labrum Readling, MD   1,000 mg at 08/16/12 0818  . alum & mag hydroxide-simeth  (MAALOX/MYLANTA) 200-200-20 MG/5ML suspension 30 mL  30 mL Oral Q4H PRN Curlene Labrum Readling, MD      . benztropine (COGENTIN) tablet 0.5 mg  0.5 mg Oral BH-qamhs Randy D Readling, MD      . diphenhydrAMINE (BENADRYL) capsule 50 mg  50 mg Oral Q8H PRN Verne Spurr, PA-C      . magnesium hydroxide (MILK OF MAGNESIA) suspension 30 mL  30 mL Oral Daily PRN Curlene Labrum Readling, MD      . risperiDONE (RISPERDAL) tablet 1 mg  1 mg Oral BH-qamhs Verne Spurr, PA-C   1 mg at 08/16/12 0816  . DISCONTD: benztropine (COGENTIN) tablet 0.5 mg  0.5 mg Oral BID Verne Spurr, PA-C   0.5 mg at 08/16/12 1610   Lab Results: No results found for this or any previous visit (from the past 48 hour(s)).  Physical Findings: AIMS: Facial and Oral Movements Muscles of Facial Expression: None, normal Lips and Perioral Area: None, normal Jaw: None, normal Tongue: None, normal,Extremity Movements Upper (arms, wrists, hands, fingers): None, normal Lower (legs, knees, ankles, toes): None, normal, Trunk Movements Neck, shoulders, hips: None, normal, Overall Severity Severity of abnormal movements (highest score from questions above): None, normal Incapacitation due to abnormal movements: None, normal Patient's awareness of abnormal movements (rate only patient's report): No Awareness, Dental Status Current problems with teeth and/or dentures?: No Does patient usually wear dentures?: No   Review of Systems:  Neurological: No headaches, seizures or dizziness reported.  G.I.: The patient denies any constipation or stomach upset today.  Musculoskeletal: The patient has  a fractured Right Humorous with ORIF on August 12, 2012.   Time was spent today discussing with the patient his current symptoms. The patient states that he had difficulty initiating and maintaining sleep last night and according to staff he slept 2 hours.  The patient reports a good appetite and denies any significant feelings of sadness, anhedonia or depressed  mood.  He denies any suicidal or homicidal ideations and denies any auditory or visual hallucinations or delusional thinking. The patient reports mild anxiety symptoms today related to not sleeping well last night.   Time was spent discussing with the patient his follow up plans related to his broken arm.  The patient states the operation was performed by Dr. Ave Filter with [facility] but he has no knowledge of any arranged follow up.  Treatment Plan Summary:  1. Daily contact with patient to assess and evaluate symptoms and progress in treatment.  2. Medication management  3. The patient will deny suicidal ideations or homicidal ideations for 48 hours prior to discharge and have a depression and anxiety rating of 3 or less. The patient will also deny any auditory or visual hallucinations or delusional thinking.  4. The patient will deny any symptoms of substance withdrawal at time of discharge.   Plan:  1. Will continue the medication Risperdal at 1 mg po q am and hs for psychosis.  2. Will continue the medication Cogentin at 0.5 mg po q am and hs for EPS.  3. Will start Trazodone at 50 mgs po qhs - prn for sleep. 4. Will continue the patient on a 1 to 1 due to fall risk.  5. Laboratory Studies reviewed.  6. Will continue to monitor.  7. Once the Case Managers returns after the Holiday, we will attempt to obtain follow up information related to his broken arm.  RANDY READLING 08/16/2012, 1:19 PM

## 2012-08-16 NOTE — Progress Notes (Signed)
D   Pt has slept some this afternoon  He reports feeling drowsy but denies any itching in his casted arm    He is alert and oriented and denies voices suicidal and homicidal ideation  Pt continues to be unsteady on his feet A   Verbal support given  Medications administered and effectiveness monitored  1 to 1 sitter R   Pt safe at present

## 2012-08-17 MED ORDER — RISPERIDONE 1 MG PO TABS: 1.0000 mg | ORAL_TABLET | ORAL | Status: DC

## 2012-08-17 MED ORDER — BENZTROPINE MESYLATE 0.5 MG PO TABS: 0.5000 mg | ORAL_TABLET | ORAL | Status: DC

## 2012-08-17 MED ORDER — DIPHENHYDRAMINE HCL 50 MG PO CAPS: ORAL_CAPSULE | ORAL | Status: DC

## 2012-08-17 MED ORDER — ACETAMINOPHEN 500 MG PO TABS: 1000.0000 mg | ORAL_TABLET | Freq: Four times a day (QID) | ORAL | Status: AC | PRN

## 2012-08-17 NOTE — Progress Notes (Signed)
Psychoeducational Group Note  Date:  08/17/2012 Time:  0930  Group Topic/Focus:  Recovery Goals:   The focus of this group is to identify appropriate goals for recovery and establish a plan to achieve them.  Participation Level:  Minimal  Participation Quality:  Appropriate  Affect:  Appropriate  Cognitive:  Appropriate  Insight:  Limited  Engagement in Group:  Limited  Additional Comments:  Pt attended group but did not participate much.  Lennie Dunnigan E 08/17/2012, 11:35 AM

## 2012-08-17 NOTE — Progress Notes (Addendum)
1600   Patient is walking hallway with 1:1 present for safety.  Right arm wrapped with ace wrap.  Patient denied SI and HI at this time.  Denied A/V hallucinations.  Denied pain at this time.   Stated he has eaten good today and feels rested.  No pain or distress noted on patient's face or body movements.   Respirations even and unlabored.  Will continue to monitor patient with 1:1 for safety per MD order.  Patient has been cooperative and pleasant. 1830   Patient's mother visited patient tonight.  Patient enjoyed his dinner.   While mom was with patient, patient stated he feels anxious because he is going home tomorrow to a new routine.  Mom believes he is confused which patient denied.  Patient stated he will "chill out and not worry about family problems".  Informed patient to tell staff his feelings in the morning meeting.  Informed patient that discharge information will be given to him explaining appointments and medication information.  Patient denied SI & HI.   Denied A/V hallucinations.  Denied pain at this time.  1:1 continues for safety per MD order.  No signs or symptoms of pain or discomfort noted on patient's face or body movements.  Safety maintained on MD order with 1:1.

## 2012-08-17 NOTE — Progress Notes (Signed)
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  Cannabis Abuse.   The patient was seen today and reports the following:   ADL's: Intact.  Sleep: The patient reports to having difficulty sleeping last night and according to staff he slept 4 hours last night.  Appetite: The patient reports a good appetite today.   Mild>(1-10) >Severe  Hopelessness (1-10): 2-3  Depression (1-10): 3  Anxiety (1-10): 5   Suicidal Ideation: The patient adamantly denies any suicidal ideations today.  Plan: No  Intent: No  Means: No   Homicidal Ideation: The patient adamantly denies any homicidal ideations today.  Plan: No  Intent: No.  Means: No   General Appearance/Behavior: The patient remains friendly and cooperative today with this provider.  Eye Contact: Good.  Speech: Appropriate in rate and volume with no pressuring of speech noted today.  Motor Behavior: wnl.  Level of Consciousness: Alert and Oriented x 3.  Mental Status: Alert and Oriented x 3.  Mood: Appears mildly depressed today.  Affect: Appears mildly constricted.  Anxiety Level: Mild to moderate anxiety noted today.  (Patient states he is anxious about still being in the hospital). Thought Process: wnl.  Thought Content: The patient denies any auditory or visual hallucinations or delusional thinking.  Perception: wnl.  Judgment: Fair to Good.  Insight: Fair to Good.  Cognition: Oriented to person, place and time.  Sleep:  Number of Hours: 4    Vital Signs:Blood pressure 135/78, pulse 106, temperature 98 F (36.7 C), temperature source Oral, resp. rate 17, height 5\' 2"  (1.575 m), weight 79.379 kg (175 lb), SpO2 99.00%.  Current Medications: Current Facility-Administered Medications  Medication Dose Route Frequency Provider Last Rate Last Dose  . acetaminophen (TYLENOL) tablet 1,000 mg  1,000 mg Oral Q6H PRN Curlene Labrum  Zonya Gudger, MD   1,000 mg at 08/17/12 1140  . alum & mag hydroxide-simeth (MAALOX/MYLANTA) 200-200-20 MG/5ML suspension 30 mL  30 mL Oral Q4H PRN Curlene Labrum Jeremiah Curci, MD      . benztropine (COGENTIN) tablet 0.5 mg  0.5 mg Oral BH-qamhs Curlene Labrum Adelheid Hoggard, MD   0.5 mg at 08/17/12 0756  . diphenhydrAMINE (BENADRYL) capsule 50 mg  50 mg Oral Q8H PRN Verne Spurr, PA-C   50 mg at 08/16/12 2128  . magnesium hydroxide (MILK OF MAGNESIA) suspension 30 mL  30 mL Oral Daily PRN Curlene Labrum Loraine Freid, MD      . risperiDONE (RISPERDAL) tablet 1 mg  1 mg Oral BH-qamhs Verne Spurr, PA-C   1 mg at 08/17/12 0755  . DISCONTD: benztropine (COGENTIN) tablet 0.5 mg  0.5 mg Oral BID Verne Spurr, PA-C   0.5 mg at 08/16/12 4098   Lab Results: No results found for this or any previous visit (from the past 48 hour(s)).  Physical Findings: AIMS: Facial and Oral Movements Muscles of Facial Expression: None, normal Lips and Perioral Area: None, normal Jaw: None, normal Tongue: None, normal,Extremity Movements Upper (arms, wrists, hands, fingers): None, normal Lower (legs, knees, ankles, toes): None, normal, Trunk Movements Neck, shoulders, hips: None, normal, Overall Severity Severity of abnormal movements (highest score from questions above): None, normal Incapacitation due to abnormal movements: None, normal Patient's awareness of abnormal movements (rate only patient's report): No Awareness, Dental Status Current problems with teeth and/or dentures?: No Does patient usually wear dentures?: No   Review of Systems:  Neurological: No headaches, seizures or  dizziness reported.  G.I.: The patient denies any constipation or stomach upset today.  Musculoskeletal: The patient has a fractured Right Humorous with ORIF on August 12, 2012.   Time was spent today discussing with the patient his current symptoms. The patient states that he is having difficulty initiating and maintaining sleep and according to staff he slept 4  hours last night. The patient reports a good appetite and reports mild feelings of sadness, anhedonia and depressed mood.  He denies any suicidal or homicidal ideations and denies any auditory or visual hallucinations or delusional thinking. The patient reports mild anxiety symptoms today related to still being in the hospital.  The patient now has follow up arranged with his Orthropedic Surgeon and family agrees with discharge tomorrow.   Treatment Plan Summary:  1. Daily contact with patient to assess and evaluate symptoms and progress in treatment.  2. Medication management  3. The patient will deny suicidal ideations or homicidal ideations for 48 hours prior to discharge and have a depression and anxiety rating of 3 or less. The patient will also deny any auditory or visual hallucinations or delusional thinking.  4. The patient will deny any symptoms of substance withdrawal at time of discharge.   Plan:  1. Will continue the medication Risperdal at 1 mg po q am and hs for psychosis.  2. Will continue the medication Cogentin at 0.5 mg po q am and hs for EPS.  3. Will increase the medication Trazodone to 100 mgs po qhs - prn for sleep.  4. Will continue the patient on a 1 to 1 due to fall risk.  5. Laboratory Studies reviewed.  6. Will continue to monitor.  7. The patient now has follow up arranged with his Orthropedic Surgeon and family agrees with discharge tomorrow.  Dama Hedgepeth 08/17/2012, 12:50 PM

## 2012-08-17 NOTE — Progress Notes (Signed)
D: Pt resting. Eyes closed. Resp even and unlabored. A: Continue 1:1 for pt safety. R: Pt remains safe on the unit.

## 2012-08-17 NOTE — Progress Notes (Signed)
Patient resting quietly with eyes closed. Respirations even and unlabored, no distress noted. Q15 minute check continues to maintain safety. 

## 2012-08-17 NOTE — Progress Notes (Signed)
Close Observation Nursing Note:  D: Pt resting. Eyes closed. Respirations even and unlabored. A: Continue close observation for pt safety. R: Pt remains safe on the unit.  

## 2012-08-17 NOTE — Discharge Planning (Signed)
08/17/2012  SW met with pt and his father.  Pt would like to be linked back to his ACTT services to resume treatment for follow-up.  Pt father discussed home healthcare request to assist pt with a broken arm that he had previously discussed with Misty Stanley in Risk Management.  SW agreed to follow-up to inquire about the status of requests made to risk management.  SW continue to monitor and assess for referrals.    Clarice Pole, LCASA 08/17/2012, 5:38 PM

## 2012-08-17 NOTE — Progress Notes (Signed)
[facility] Group Notes:  (Counselor/Nursing/MHT/Case Management/Adjunct)  08/17/2012 4:58 PM  Type of Therapy:  Group Therapy  Participation Level:  Did Not Attend    Hunter [name] 08/17/2012, 4:58 PM

## 2012-08-17 NOTE — Tx Team (Signed)
Interdisciplinary Treatment Plan Update (Adult) Date: 08/17/2012  Time Reviewed: 9:38 AM  Progress in Treatment: Attending groups: Yes Participating in groups: Yes Taking medication as prescribed: Yes Tolerating medication: Yes Family/Significant othe contact made: Counselor to assess for contact with pt parents. Patient understands diagnosis: Yes Discussing patient identified problems/goals with staff: Yes Medical problems stabilized or resolved: Yes Denies suicidal/homicidal ideation: Yes Issues/concerns per patient self-inventory: None identified Other: N/A New problem(s) identified: None Identified Reason for Continuation of Hospitalization: Anxiety Depression Medication stabilization Interventions implemented related to continuation of hospitalization: mood stabilization, medication monitoring and adjustment, group therapy and psycho education, safety checks q 15 mins Additional comments: N/A Estimated length of stay:  Discharge Plan: SW is assessing for appropriate referrals.  SW to contact Dr. Ave Filter and follow-up mental health providers. SW to make referral for ACTT services to resume per pt and father request. New goal(s): N/A Review of initial/current patient goals per problem list:  1. Goal(s): Reduce depressive symptoms  Met: No  Target date: by discharge  As evidenced by: Reducing depression from a 10 to a 3 as reported by pt.  2. Goal (s): Eliminate Suicidal Ideation  Met: No  Target date: by discharge  As evidenced by: Eliminate suicidal ideation.  3. Goal(s): Reduce Psychosis  Met: No  Target date: by discharge  As evidenced by: Reduce psychotic symptoms to baseline, as reported by pt.  4. Goal(s):  Address follow-up with Dr. Ave Filter  Met: No  Target date: by discharge  As evidenced by: Attendees: Patient: Hunter [name] 08/17/2012 10:05 AM  Family:    Physician: Franchot Gallo, MD 08/17/2012 9:38 AM   Nursing:    Case Manager: Clarice Pole, LCASA 08/17/2012 9:38 AM   Counselor: Veto Kemps, MT-BC 08/17/2012 9:38 AM   Other: Joslyn Devon, RN 08/17/2012 9:38 AM   Other:    Other:    Other:    Scribe for Treatment Team:  Clarice Pole, LCASA 08/17/2012 9:38 AM

## 2012-08-18 NOTE — Progress Notes (Signed)
RN received phone call from Dalene Seltzer (Director of Nursing).  She inquired about patient ability to care for hisself after discharge.  Patient father requesting Home Health be in place/set up  before he picks him up. RN explained that I would consult with treatment team and staff would phone her back [phone]).

## 2012-08-18 NOTE — Progress Notes (Signed)
08/18/2012         Time: 0930      Group Topic/Focus: The focus of this group is on enhancing the patient's understanding of leisure, barriers to leisure, and the importance of engaging in positive leisure activities upon discharge for improved total health.  Participation Level: Active  Participation Quality: Appropriate and Attentive  Affect: Appropriate  Cognitive: Oriented   Additional Comments: Patient able to identify positive leisure interests he can engage in upon discharge.   Alainah Phang 08/18/2012 10:13 AM 

## 2012-08-18 NOTE — Progress Notes (Signed)
D:  Pt walking about in the hallway.  He ate lunch and is waiting on his father to come and get him for discharge.   A:  Offered support, RN has reviewed his discharge paperwork with him.  Rn called his father and discussed Glenn being ready for discharge.  He voiced some concerns and stated that we needed to have some type of Home Health services in place for him prior to him leaving.  CM to make follow up call to father concerning this matter.   R:  Remains on 1:1 for safety.  Will continue to monitor.

## 2012-08-18 NOTE — Progress Notes (Signed)
D:  Pt about on the unit this morning.  Pt states he is suppose to be going home today.  He denies SI/HI and A/V hallucinations.  Mood depressed.  Smiles on occasion.  A:  Given meds this am as per order.  Offered support and encouragement. R:  Pt. receptive to staff.  Remains on 1:1 observation.  Pt. Safe on the unit at this time.  Will continue to monitor.

## 2012-08-18 NOTE — Progress Notes (Signed)
Pt discharged from the unit.  His father picked him up.  He denies SI/HI and A/V hallucinations.  RN reviewed follow up appointments and discharge medications with patient and also with patient father prior to pt leaving with voiced understanding given.  Pt. obtained all belongings prior to discharge.  He was pleasant and thankful to staff upon his departure.
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
## 2012-08-18 NOTE — Progress Notes (Signed)
[facility] Group Notes:  (Counselor/Nursing/MHT/Case Management/Adjunct)  08/18/2012 3:39 PM  Type of Therapy:  Psychoeducational Skills  Participation Level:  Active  Participation Quality:  Attentive  Affect:  Blunted  Cognitive:  Oriented  Insight:  Limited  Engagement in Group:  Limited  Engagement in Therapy:  Limited  Modes of Intervention:  Education and Support  Summary of Progress/Problems: Patient was attentive to speaker from mental health association. He asked appropriate questions.  Hunter [name] 08/18/2012, 3:39 PM

## 2012-08-19 ENCOUNTER — Encounter (HOSPITAL_COMMUNITY): Payer: Self-pay | Admitting: Orthopedic Surgery

## 2012-08-20 NOTE — Progress Notes (Signed)
Patient Discharge Instructions:  After Visit Summary (AVS):   Faxed to:  08/20/2012 Psychiatric Admission Assessment Note:   Faxed to:  08/20/2012 Suicide Risk Assessment - Discharge Assessment:   Faxed to:  08/20/2012 Faxed/Sent to the Next Level Care provider:  08/20/2012  Faxed to [facility] @ [phone] & [facility]  Trent, Gavin Potters Brittini, 08/20/2012, 10:52 AM

## 2012-08-25 ENCOUNTER — Telehealth: Payer: Self-pay

## 2012-08-25 DIAGNOSIS — F209 Schizophrenia, unspecified: Secondary | ICD-10-CM

## 2012-08-25 DIAGNOSIS — F200 Paranoid schizophrenia: Secondary | ICD-10-CM

## 2012-08-25 NOTE — Telephone Encounter (Signed)
pts father, michael left v/m pt seen 08/03/12; discussed pt having problem sleeping,pts father request med sent to [facility] [address] to help pt sleep.Please advise.

## 2012-08-25 NOTE — Telephone Encounter (Signed)
Since he is on psychiatric medication- that needs to come from his psychiatrist -to prevent potential med reactions Please adv them to call their psychiatrist to help with that

## 2012-08-26 NOTE — Telephone Encounter (Signed)
I also found out more info. Apparently, he was referred to ACT at discharge. He didn't qualify because he has [place] AND [employer] with [facility] as his primary. Private psychiatrists want cash upfront and don't accept medicaid at all. According to [name], his father was told to drop one or the other, but as long as he has both, he is going to be in the same situation. Hopefully someone will be able to help him out.

## 2012-08-26 NOTE — Telephone Encounter (Signed)
Thanks for the heads up -- the family will have to make a decision because he really needs psychiatric care I'll copy this to [name] too - thanks for the efforts of both of you

## 2012-08-26 NOTE — Telephone Encounter (Signed)
Now he is saying they are looking for a regular one. He last saw Dr. Betti Cruz at [facility] who put him on the risperdone. The number is [phone].

## 2012-08-26 NOTE — Telephone Encounter (Signed)
Thank you :)

## 2012-08-26 NOTE — Telephone Encounter (Signed)
Notified patient's father to check with psych. He said he checked with them first and they told him to call you. The only med he is currently taking is risperdol 1 mg BID. He has tried OTC benadryl with no help.

## 2012-08-26 NOTE — Telephone Encounter (Signed)
Ask who his psychiatrist is please and get a phone number- I need to check in with them before I am comfortable px anything  I will get in touch with that office when I am back in the office tomorrow
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
## 2012-08-26 NOTE — Telephone Encounter (Signed)
It was Dr. Rae Lips Dr. Betti Cruz. No one at [facility] could advise who pt was supposed to see outpatient. Nothing noted on records from admission. I talked with outpatient BH who said he wasn't scheduled with them, inpatient BH who said they had no idea and he would have to follow up with you and then they transferred me to medical records. Apparently official D/C summary hasn't been dictated yet, but D/C instructions didn't even mention an outpatient follow up. So no one knows who he is supposed to see including the patient/parent. I think the best option appears to be what you're doing by referring him yourself so he doesn't fall through the cracks. I notified his father and he said that no one at the hospital made an outpatient follow up for him and so he has been trying to do it himself with the soonest he could get in being in December.  I advised we would take care of the referral for him. I advised until then , you wouldn't be able to prescribe anything for sleep for fear of meds causing negative/harmful reactions. Father verbalized understanding and greatly appreciated your help in getting him set up somewhere outpatient.

## 2012-08-27 NOTE — Telephone Encounter (Signed)
Called patients father and gave him [facility] phone number. Told him to use the [facility] insurance and not the secondary Medicaid and would probably get an appt with them. Called [facility] and they made patient an appt with Anne Fu for Monday Sept 16th, faxed over your last OV note to them.

## 2012-08-27 NOTE — Telephone Encounter (Signed)
thanks

## 2012-09-05 NOTE — Discharge Summary (Signed)
Physician Discharge Summary Note  Patient:  Hunter [name] is an 20 y.o., male MRN:  [id number] DOB:  02-21-1991 Patient phone:  [phone] (home)  Patient address:   [address] [place] [address]   Date of Admission:  08/13/2012 Date of Discharge: 08/18/2012  Discharge Diagnoses: Principal Problem:  *Schizophrenia, paranoid type Active Problems:  Cannabis abuse  Axis Diagnosis:  AXIS I: 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  Cannabis Abuse.  AXIS II: Deferred.  AXIS III: 1. Premature Birth.  2. Retinal Detachment.  3. Reactive Airway Disease.  4. Right Arm Fracture with ORIF.  AXIS IV: Chronic Mental Illness. Some Substance Abuse History.  AXIS V: GAF at time of admission approximately 35. GAF at time of discharge approximately 55.   Level of Care:  Inpatient Hospitalization.  Reason For Admission: [name] presented to [facility] for evaluation and treatment at his parents request due to some bizarre behaviors he had been exhibiting. He was sent to the ED for medical clearance and unfortunately suffered a fracture to his arm that had to be surgically repaired. He was admitted to in patient for post op care and evaluated by Dr. Ferol Luz.  Hospital Course:   The patient attended treatment team meeting this am and met with treatment team members. The patient's symptoms, treatment plan and response to treatment was discussed. The patient endorsed that their symptoms have improved. The patient also stated that they felt stable for discharge.  They reported that from this hospital stay they had learned many coping skills.  In other to maintain their psychiatric stability, they will continue psychiatric care on an outpatient basis. They will follow-up as outlined below.  In addition they were instructed  to take all your medications as prescribed by their mental healthcare provider and to report any adverse effects and or reactions from your medicines to their  outpatient provider promptly.  The patient is also instructed and cautioned to not engage in alcohol and or illegal drug use while on prescription medicines.  In the event of worsening symptoms the patient is instructed to call the crisis hotline, 911 and or go to the nearest ED for appropriate evaluation and treatment of symptoms.   Also while a patient in this hospital, the patient received medication management for his psychiatric symptoms. They were ordered and received as outlined below:    Medication List     As of 09/05/2012  8:35 PM    TAKE these medications      Indication    benztropine 0.5 MG tablet   Commonly known as: COGENTIN   Take 1 tablet (0.5 mg total) by mouth 2 (two) times daily in the am and at bedtime.. For possible side effects from the medication Risperdal.       diphenhydrAMINE 50 MG capsule   Commonly known as: BENADRYL   Please take 1 capsule as often as every 8 hours for itching.       risperiDONE 1 MG tablet   Commonly known as: RISPERDAL   Take 1 tablet (1 mg total) by mouth 2 (two) times daily in the am and at bedtime.. For psychosis and mood stabilization.        They were also enrolled in group counseling sessions and activities in which they participated actively.       Follow-up Information    Follow up with [facility] on 08/25/2012. (Appt with Dr. Ave Filter at 1:30pm)    Contact information:  [address] [place], [place] [address] [phone]      Follow up with [facility] on 08/24/2012. (Appt made with Joycelyn Schmid, LPC at 10am for clinical intake.)    Contact information:   [address] [place], [place] [address] [phone]      Follow up with [facility] on 08/19/2012. (Appt with patient at home for in-home care around 2pm.  Services to begin on 08-20-12 and last for 14 days duration.  Agency to contact patient to confirm appt times. )    Contact information:   [address] [place], [place]  [address] [phone] fax [phone]        Upon discharge, patient adamantly denies suicidal, homicidal ideations, auditory, visual hallucinations and or delusional thinking. They left [facility] with all personal belongings via personal transportation in no apparent distress.  Consults:  Please see electronic medical record for details.  Significant Diagnostic Studies:  Please see electronic medical record for details.  Discharge Vitals:   Blood pressure 138/80, pulse 101, temperature 97 F (36.1 C), temperature source Oral, resp. rate 19, height 5\' 2"  (1.575 m), weight 79.379 kg (175 lb), SpO2 99.00%..  Mental Status Exam: Demographic Factors:  Adolescent or young adult and Unemployed  Mental Status Per Nursing Assessment::  On Admission: (Pt denies SI/HI)  On Discharge: Time was spent today discussing with the patient his current symptoms. The patient states that he slept well last night and according to staff he slept almost 6 hours. The patient reports a good appetite and denies any significant feelings of sadness, anhedonia or depressed mood. He adamantly denies any suicidal or homicidal ideations and denies any auditory or visual hallucinations or delusional thinking. The patient reports mild to moderate anxiety symptoms today related to still being in the hospital but is excited about returning home today. Overall the patient states that he is "doing well" and is ready for discharge home to outpatient follow up.  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  Cannabis Abuse.  The patient was seen today and reports the following:  ADL's: Intact.  Sleep: The patient reports to sleeping well last night.  Appetite: The patient reports a good appetite today.  Mild>(1-10) >Severe  Hopelessness (1-10): 0  Depression (1-10): 0  Anxiety (1-10): 5  Suicidal Ideation: The patient adamantly denies any suicidal ideations today.   Plan: No  Intent: No  Means: No  Homicidal Ideation: The patient adamantly denies any homicidal ideations today.  Plan: No  Intent: No.  Means: No  General Appearance/Behavior: The patient remained friendly and cooperative today with this provider.  Eye Contact: Good.  Speech: Appropriate in rate and volume with no pressuring of speech noted today.  Motor Behavior: wnl.  Level of Consciousness: Alert and Oriented x 3.  Mental Status: Alert and Oriented x 3.  Mood: Appears essentially euthymic today.  Affect: Appears mildly constricted.  Anxiety Level: Mild to moderate anxiety noted today. (Patient states he is anxious about still being in the hospital).  Thought Process: wnl.  Thought Content: The patient denies any auditory or visual hallucinations or delusional thinking.  Perception: wnl.  Judgment: Fair to Good.  Insight: Fair to Good.  Cognition: Oriented to person, place and time.  Loss Factors:  Recent Injury to Right Arm.  Historical Factors:  History of Mental Illness. History of Substance Abuse.  Risk Reduction Factors:  Good family support. The patient is involved with an ACT team. Good  access to healthcare.  Continued Clinical Symptoms:  Severe Anxiety and/or Agitation  Alcohol/Substance Abuse/Dependencies  More than one psychiatric diagnosis  Previous Psychiatric Diagnoses and Treatments  Medical Diagnoses and Treatments/Surgeries  Discharge Diagnoses:  AXIS I: 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  Cannabis Abuse.  AXIS II: Deferred.  AXIS III: 1. Premature Birth.  2. Retinal Detachment.  3. Reactive Airway Disease.  4. Right Arm Fracture with ORIF.  AXIS IV: Chronic Mental Illness. Some Substance Abuse History.  AXIS V: GAF at time of admission approximately 35. GAF at time of discharge approximately 55.  Cognitive Features That Contribute To Risk:  None Noted.  Current Medications:  Current Facility-Administered Medications   Medication   Dose  Route  Frequency  Provider  Last Rate  Last Dose   .  acetaminophen (TYLENOL) tablet 1,000 mg  1,000 mg  Oral  Q6H PRN  Curlene Labrum Laniah Grimm, MD   1,000 mg at 08/17/12 1140   .  alum & mag hydroxide-simeth (MAALOX/MYLANTA) 200-200-20 MG/5ML suspension 30 mL  30 mL  Oral  Q4H PRN  Curlene Labrum Vergie Zahm, MD     .  benztropine (COGENTIN) tablet 0.5 mg  0.5 mg  Oral  BH-qamhs  Curlene Labrum Haya Hemler, MD   0.5 mg at 08/17/12 0756   .  diphenhydrAMINE (BENADRYL) capsule 50 mg  50 mg  Oral  Q8H PRN  Verne Spurr, PA-C   50 mg at 08/16/12 2128   .  magnesium hydroxide (MILK OF MAGNESIA) suspension 30 mL  30 mL  Oral  Daily PRN  Curlene Labrum Alim Cattell, MD     .  risperiDONE (RISPERDAL) tablet 1 mg  1 mg  Oral  BH-qamhs  Verne Spurr, PA-C   1 mg at 08/17/12 0755   .  DISCONTD: benztropine (COGENTIN) tablet 0.5 mg  0.5 mg  Oral  BID  Verne Spurr, PA-C   0.5 mg at 08/16/12 0981   Lab Results: No results found for this or any previous visit (from the past 48 hour(s)).  Physical Findings:  AIMS: Facial and Oral Movements  Muscles of Facial Expression: None, normal  Lips and Perioral Area: None, normal  Jaw: None, normal  Tongue: None, normal,Extremity Movements  Upper (arms, wrists, hands, fingers): None, normal  Lower (legs, knees, ankles, toes): None, normal, Trunk Movements  Neck, shoulders, hips: None, normal, Overall Severity  Severity of abnormal movements (highest score from questions above): None, normal  Incapacitation due to abnormal movements: None, normal  Patient's awareness of abnormal movements (rate only patient's report): No Awareness, Dental Status  Current problems with teeth and/or dentures?: No  Does patient usually wear dentures?: No  Review of Systems:  Neurological: No headaches, seizures or dizziness reported.  G.I.: The patient denies any constipation or stomach upset today.  Musculoskeletal: The patient has a fractured Right Humorous with ORIF on August 12, 2012.  Time was spent today  discussing with the patient his current symptoms. The patient states that he slept well last night and according to staff he slept almost 6 hours. The patient reports a good appetite and denies any significant feelings of sadness, anhedonia or depressed mood. He adamantly denies any suicidal or homicidal ideations and denies any auditory or visual hallucinations or delusional thinking. The patient reports mild to moderate anxiety symptoms today related to still being in the hospital but is excited about returning home today. Overall the patient states that he is "doing well" and is ready for discharge home  to outpatient follow up.  Treatment Plan Summary:  1. Daily contact with patient to assess and evaluate symptoms and progress in treatment.  2. Medication management  3. The patient will deny suicidal ideations or homicidal ideations for 48 hours prior to discharge and have a depression and anxiety rating of 3 or less. The patient will also deny any auditory or visual hallucinations or delusional thinking.  4. The patient will deny any symptoms of substance withdrawal at time of discharge.  Plan:  1. Will continue the medication Risperdal at 1 mg po q am and hs for psychosis.  2. Will continue the medication Cogentin at 0.5 mg po q am and hs for EPS.  3. Will continue the medication Trazodone at 100 mgs po qhs - prn for sleep.  4. Will continue the patient on a 1 to 1 due to fall risk until discharge.  5. Laboratory Studies reviewed.  6. Will continue to monitor.  7. The patient now has follow up arranged with his Orthropedic Surgeon and family agrees with discharge today.  Suicide Risk:  Minimal: No identifiable suicidal ideation. Patients presenting with no risk factors but with morbid ruminations; may be classified as minimal risk based on the severity of the depressive symptoms  Plan Of Care/Follow-up recommendations:  Activity: As tolerated. Please be careful to protect right arm.  Diet:  Regular Diet.  Other: Please take all medications only as directed and keep all sceduled follow up appointments. Please abstain from any use of alcohol ir illicit drugs. Please return to your local Emergency Department should you have a return of any thoughts of harming yourself or others.  Discharge destination:  Home  Is patient on multiple antipsychotic therapies at discharge:  No  Has Patient had three or more failed trials of antipsychotic monotherapy by history: N/A Recommended Plan for Multiple Antipsychotic Therapies: N/A Discharge Orders    Future Orders Please Complete By Expires   Diet - low sodium heart healthy      Increase activity slowly      Discharge instructions      Comments:   Please take all medications only as directed and keep all scheduled follow up appointments with your mental health provider and with your Orthopedic specialist.       Medication List     As of 09/05/2012  8:35 PM    TAKE these medications      Indication    benztropine 0.5 MG tablet   Commonly known as: COGENTIN   Take 1 tablet (0.5 mg total) by mouth 2 (two) times daily in the am and at bedtime.. For possible side effects from the medication Risperdal.       diphenhydrAMINE 50 MG capsule   Commonly known as: BENADRYL   Please take 1 capsule as often as every 8 hours for itching.       risperiDONE 1 MG tablet   Commonly known as: RISPERDAL   Take 1 tablet (1 mg total) by mouth 2 (two) times daily in the am and at bedtime.. For psychosis and mood stabilization.            Follow-up Information    Follow up with [facility] on 08/25/2012. (Appt with Dr. Ave Filter at 1:30pm)    Contact information:   [address] [place], [place] [address] [phone]      Follow up with [facility] on 08/24/2012. (Appt made with Joycelyn Schmid, LPC at 10am for clinical intake.)    Contact  information:   [address] [place], [place] [address] [phone]      Follow up with  [facility] on 08/19/2012. (Appt with patient at home for in-home care around 2pm.  Services to begin on 08-20-12 and last for 14 days duration.  Agency to contact patient to confirm appt times. )    Contact information:   [address] [place], [place] [address] [phone] fax [phone]        Follow-up recommendations:   Activities: Resume typical activities Diet: Resume typical diet Other: Follow up with outpatient provider and report any side effects to out patient prescriber.  Comments:  Take all your medications as prescribed by your mental healthcare provider. Report any adverse effects and or reactions from your medicines to your outpatient provider promptly. Patient is instructed and cautioned to not engage in alcohol and or illegal drug use while on prescription medicines. In the event of worsening symptoms, patient is instructed to call the crisis hotline, 911 and or go to the nearest ED for appropriate evaluation and treatment of symptoms. Follow-up with your primary care provider for your other medical issues, concerns and or health care needs.  SignedFranchot Gallo 09/05/2012 8:35 PM

## 2012-11-09 ENCOUNTER — Telehealth: Payer: Self-pay

## 2012-11-09 DIAGNOSIS — H6120 Impacted cerumen, unspecified ear: Secondary | ICD-10-CM | POA: Insufficient documentation

## 2012-11-09 DIAGNOSIS — H9190 Unspecified hearing loss, unspecified ear: Secondary | ICD-10-CM | POA: Insufficient documentation

## 2012-11-09 NOTE — Telephone Encounter (Signed)
Dena pts mother request referral for ENT re: pts not hearing well in both ears,no drainage from ears,no fever and no other symptoms. Hx of wax in ears. Pt's mother declined appt in our office preferred ENT.Please advise.

## 2012-11-09 NOTE — Telephone Encounter (Signed)
Will refer and route to [name] so she knows what is going on

## 2012-11-09 NOTE — Telephone Encounter (Signed)
ENT appt made with [facility] ENT Dr Annalee Genta and mom notified.

## 2013-02-04 ENCOUNTER — Ambulatory Visit (INDEPENDENT_AMBULATORY_CARE_PROVIDER_SITE_OTHER): Payer: 59 | Admitting: Family Medicine

## 2013-02-04 ENCOUNTER — Encounter: Payer: Self-pay | Admitting: Family Medicine

## 2013-02-04 VITALS — BP 126/68 | HR 99 | Temp 98.7°F | Ht 63.0 in | Wt 218.0 lb

## 2013-02-04 DIAGNOSIS — F17200 Nicotine dependence, unspecified, uncomplicated: Secondary | ICD-10-CM

## 2013-02-04 DIAGNOSIS — Z720 Tobacco use: Secondary | ICD-10-CM | POA: Insufficient documentation

## 2013-02-04 DIAGNOSIS — Z23 Encounter for immunization: Secondary | ICD-10-CM

## 2013-02-04 DIAGNOSIS — Z0000 Encounter for general adult medical examination without abnormal findings: Secondary | ICD-10-CM | POA: Insufficient documentation

## 2013-02-04 DIAGNOSIS — E669 Obesity, unspecified: Secondary | ICD-10-CM | POA: Insufficient documentation

## 2013-02-04 LAB — COMPREHENSIVE METABOLIC PANEL
Albumin: 4.3 g/dL (ref 3.5–5.2)
Alkaline Phosphatase: 56 U/L (ref 39–117)
Glucose, Bld: 104 mg/dL — ABNORMAL HIGH (ref 70–99)
Potassium: 4 mEq/L (ref 3.5–5.3)
Sodium: 142 mEq/L (ref 135–145)
Total Protein: 6.4 g/dL (ref 6.0–8.3)

## 2013-02-04 LAB — LIPID PANEL
Cholesterol: 167 mg/dL (ref 0–200)
LDL Cholesterol: 80 mg/dL (ref 0–99)
VLDL: 28 mg/dL (ref 0–40)

## 2013-02-04 NOTE — Assessment & Plan Note (Signed)
Reviewed health habits including diet and exercise and skin cancer prevention Also reviewed health mt list, fam hx and immunizations  Disc need for wt loss for better health Wellness labs today

## 2013-02-04 NOTE — Progress Notes (Signed)
Subjective:    Patient ID: Hunter [name], male    DOB: [birth year], 22 y.o.   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 Still lives with parents  Pt has schizophrenia  Is going fine -not on any medication right now  Seeing a therapist every 2 weeks - is helpful, a good idea  No hallucinations , or dellusions  Wt is up 43 lb Likes junk food and fast food - too much  He drinks sweet tea and also sodas  Aside from working - no exercise  He thinks he could walk  bmi is 38  Not a big drinker No marijuana at all  Smokes cigarettes 3 per day - he thinks he can quit at any time    Flu vaccine - did not get one  Wants a flu shot   Td 04- is due for that   Mood-pretty good , and motivation is up more than usual Does not want to go back to school     Chemistry      Component Value Date/Time   NA 136 08/12/2012 0452   K 4.6 08/12/2012 0452   CL 103 08/12/2012 0452   CO2 21 08/12/2012 0452   BUN 8 08/12/2012 0452   CREATININE 0.57 08/12/2012 0452      Component Value Date/Time   CALCIUM 6.7* 08/12/2012 0452   ALKPHOS 69 08/04/2012 1100   AST 128* 08/04/2012 1100   ALT 56* 08/04/2012 1100   BILITOT 1.0 08/04/2012 1100      Lab Results  Component Value Date   CHOL  Value: 97        ATP III CLASSIFICATION:  <200     mg/dL   Desirable  409-811  mg/dL   Borderline High  >=914    mg/dL   High        7/82/9562   HDL 44 02/25/2011   LDLCALC  Value: 43        Total Cholesterol/HDL:CHD Risk Coronary Heart Disease Risk Table                     Men   Women  1/2 Average Risk   3.4   3.3  Average Risk       5.0   4.4  2 X Average Risk   9.6   7.1  3 X Average Risk  23.4   11.0        Use the calculated Patient Ratio above and the CHD Risk Table to determine the patient's CHD Risk.        ATP III CLASSIFICATION (LDL):  <100     mg/dL   Optimal   130-865  mg/dL   Near or Above                    Optimal  130-159  mg/dL   Borderline  784-696  mg/dL   High  >295     mg/dL   Very High 2/84/1324   TRIG 48 02/25/2011   CHOLHDL 2.2 02/25/2011        Review of Systems Review of Systems  Constitutional: Negative for fever, appetite change, fatigue and pos for wt gain  Eyes: Negative for pain and visual disturbance.  Respiratory: Negative for cough and shortness of breath.  Cardiovascular: Negative for cp or palpitations    Gastrointestinal: Negative for nausea, diarrhea and constipation.  Genitourinary: Negative for urgency and frequency.  Skin: Negative for pallor or rash   Neurological: Negative for weakness, light-headedness, numbness and headaches.  Hematological: Negative for adenopathy. Does not bruise/bleed easily.  Psychiatric/Behavioral: Negative for dysphoric mood. The patient is not nervous/anxious.         Objective:   Physical Exam  Constitutional: He appears well-developed and well-nourished. No distress.  obese and well appearing   HENT:  Head: Normocephalic and atraumatic.  Right Ear: External ear normal.  Left Ear: External ear normal.  Nose: Nose normal.  Mouth/Throat: Oropharynx is clear and moist.  Eyes: Conjunctivae and EOM are normal. Pupils are equal, round, and reactive to light. Right eye exhibits no discharge. Left eye exhibits no discharge.  L eye abnormality is baseline   Neck: Normal range of motion. Neck supple. No JVD present. Carotid bruit is not present. No thyromegaly present.  Cardiovascular: Normal rate, regular rhythm, normal heart sounds and intact distal pulses.  Exam reveals no gallop.   Pulmonary/Chest: Effort normal and breath sounds normal. No respiratory distress. He has no wheezes. He exhibits no tenderness.  Abdominal: Soft. Bowel sounds are normal. He exhibits no distension, no abdominal bruit and no mass. There is no tenderness.  Musculoskeletal: He exhibits no edema and no  tenderness.  Lymphadenopathy:    He has no cervical adenopathy.  Neurological: He is alert. He has normal reflexes. No cranial nerve deficit. He exhibits normal muscle tone. Coordination normal.  Skin: Skin is warm and dry. No rash noted. No erythema. No pallor.  Psychiatric: His speech is normal and behavior is normal. His affect is blunt. His affect is not inappropriate. Thought content is not paranoid.  Baseline very quiet and introverted , answers questions appropriately          Assessment & Plan:

## 2013-02-04 NOTE — Patient Instructions (Addendum)
You need to work on weight loss for better health Work up to 30 minutes of exercise each day (5 days per week)  Cut calories by eliminating junk food / sugar beverages (drink water instead) and tracking calories with an app like "my fitness pal" Also cutting portions by at least a third  Tetanus shot and and flu shot today  Labs today for sugar/ thyroid and cholesterol  Take care of yourself and find some things that motivate you Continue counseling

## 2013-02-04 NOTE — Assessment & Plan Note (Signed)
Disc in detail risks of smoking and possible outcomes including copd, vascular/ heart disease, cancer , respiratory and sinus infections  Pt voices understanding Pt smokes 3 cig per day and is going to work on quitting when he is ready

## 2013-02-04 NOTE — Assessment & Plan Note (Signed)
Discussed how this problem influences overall health and the risks it imposes  Reviewed plan for weight loss with lower calorie diet (via better food choices and also portion control or program like weight watchers) and exercise building up to or more than 30 minutes 5 days per week including some aerobic activity    

## 2013-02-07 ENCOUNTER — Encounter: Payer: Self-pay | Admitting: *Deleted

## 2013-03-17 ENCOUNTER — Telehealth: Payer: Self-pay

## 2013-03-17 MED ORDER — KETOCONAZOLE 2 % EX SHAM: MEDICATED_SHAMPOO | CUTANEOUS | Status: DC

## 2013-03-17 NOTE — Telephone Encounter (Signed)
Sorry about that - the product can be used for both- I want him to use it on just his scalp-thanks

## 2013-03-17 NOTE — Telephone Encounter (Signed)
Pt's mother said he does need Rx for his skin and not just his scalp, pt's mother needs new Rx sent to pharmacy. Rx sent to pharmacy

## 2013-03-17 NOTE — Telephone Encounter (Signed)
Hunter [name] pts mother left v/m requesting refill of prescription dandruff shampoo, Nizoral 2% shampoo with instructions to lather body and rinse 2-3 times weekly as directed to [facility] [address].Hunter [name] said now just needs for dandruff on head.Please advise.

## 2014-05-17 ENCOUNTER — Ambulatory Visit (INDEPENDENT_AMBULATORY_CARE_PROVIDER_SITE_OTHER): Payer: 59 | Admitting: Family Medicine

## 2014-05-17 ENCOUNTER — Encounter: Payer: Self-pay | Admitting: Family Medicine

## 2014-05-17 ENCOUNTER — Encounter (INDEPENDENT_AMBULATORY_CARE_PROVIDER_SITE_OTHER): Payer: Self-pay

## 2014-05-17 VITALS — BP 116/70 | HR 70 | Temp 98.0°F | Ht 62.25 in | Wt 208.2 lb

## 2014-05-17 DIAGNOSIS — J45909 Unspecified asthma, uncomplicated: Secondary | ICD-10-CM

## 2014-05-17 DIAGNOSIS — E669 Obesity, unspecified: Secondary | ICD-10-CM

## 2014-05-17 DIAGNOSIS — F17200 Nicotine dependence, unspecified, uncomplicated: Secondary | ICD-10-CM

## 2014-05-17 DIAGNOSIS — Z720 Tobacco use: Secondary | ICD-10-CM

## 2014-05-17 DIAGNOSIS — Z0000 Encounter for general adult medical examination without abnormal findings: Secondary | ICD-10-CM

## 2014-05-17 MED ORDER — ALBUTEROL SULFATE HFA 108 (90 BASE) MCG/ACT IN AERS: 2.0000 | INHALATION_SPRAY | RESPIRATORY_TRACT | Status: DC | PRN

## 2014-05-17 NOTE — Progress Notes (Signed)
Pre visit review using our clinic review tool, if applicable. No additional management support is needed unless otherwise documented below in the visit note. 

## 2014-05-17 NOTE — Patient Instructions (Signed)
Keep working on quitting smoking  For occasional wheezing - here is a px for an albuterol inhaler- have it on hand in case you need it  If you have more problems with wheezing or breathing please let me know  Keep loosing weight - in addition to smaller portions- try to eat healthier and think about exercise - bike/swim  Take care of yourself

## 2014-05-17 NOTE — Progress Notes (Signed)
Subjective:    Patient ID: Hunter [name], male    DOB: 04-03-1991, 23 y.o.   MRN: [id number]  HPI Here for health maintenance exam and to review chronic medical problems   Doing well overall   Still working at the [employer]   Has cut back on smoking a lot - 1 pack every 1-2 weeks - he smokes if he has a drink usually  On weekends occ has a drink  ? If he will quit alltogether  occ e cig  occ has trouble getting a deep breath /and has prod cough in the am   Did not get a flu shot this season  Td utd from 2/14   Wt is down 10 lb  Eating less / smaller portions  Some junk food  He is interested in exercise - he likes to do sit ups/calesthenics  Has L foot pain when he walks - has an ankle problem (since childhood)- congenital problem- surgery may be an option later  Is interested in swimming   Mood/schizophrenia -still on no medicine - is happy about that  No longer seeing a counselor  Stress level is not too high   Lab Results  Component Value Date   CHOL 167 02/04/2013   HDL 59 02/04/2013   LDLCALC 80 02/04/2013   TRIG 141 02/04/2013   CHOLHDL 2.8 02/04/2013    Lab Results  Component Value Date   HGBA1C  Value: 5.6 (NOTE)                                                                       According to the ADA Clinical Practice Recommendations for 2011, when HbA1c is used as a screening test:   >=6.5%   Diagnostic of Diabetes Mellitus           (if abnormal result  is confirmed)  5.7-6.4%   Increased risk of developing Diabetes Mellitus  References:Diagnosis and Classification of Diabetes Mellitus,Diabetes Care,2011,34(Suppl 1):S62-S69 and Standards of Medical Care in         Diabetes - 2011,Diabetes Care,2011,34  (Suppl 1):S11-S61. 02/25/2011      Review of Systems Review of Systems  Constitutional: Negative for fever, appetite change, fatigue and unexpected weight change.  Eyes: Negative for pain and visual disturbance.  Respiratory: Negative for cough and shortness  of breath.   Cardiovascular: Negative for cp or palpitations    Gastrointestinal: Negative for nausea, diarrhea and constipation.  Genitourinary: Negative for urgency and frequency.  Skin: Negative for pallor or rash   Neurological: Negative for weakness, light-headedness, numbness and headaches.  Hematological: Negative for adenopathy. Does not bruise/bleed easily.  Psychiatric/Behavioral: Negative for dysphoric mood. The patient is not nervous/anxious.  pos for hx of schizophrenia currently not problematic w/o meds /neg for hallucinations or delusions       Objective:   Physical Exam  Constitutional: He appears well-developed and well-nourished. No distress.  obese and well appearing   HENT:  Head: Normocephalic and atraumatic.  Right Ear: External ear normal.  Left Ear: External ear normal.  Nose: Nose normal.  Mouth/Throat: Oropharynx is clear and moist.  Eyes: Conjunctivae and EOM are normal. Pupils are equal, round, and reactive to light. Right eye exhibits no discharge.  Left eye exhibits no discharge. No scleral icterus.  Baseline changes in L eye  Neck: Normal range of motion. Neck supple. No JVD present. Carotid bruit is not present. No thyromegaly present.  Cardiovascular: Normal rate, regular rhythm, normal heart sounds and intact distal pulses.  Exam reveals no gallop.   Pulmonary/Chest: Effort normal and breath sounds normal. No respiratory distress. He has no wheezes. He exhibits no tenderness.  Harsh bs Not distant    Abdominal: Soft. Bowel sounds are normal. He exhibits no distension, no abdominal bruit and no mass. There is no tenderness.  Musculoskeletal: He exhibits no edema and no tenderness.  Lymphadenopathy:    He has no cervical adenopathy.  Neurological: He is alert. He has normal reflexes. No cranial nerve deficit. He exhibits normal muscle tone. Coordination normal.  Skin: Skin is warm and dry. No rash noted. No erythema. No pallor.  Psychiatric: His  speech is normal and behavior is normal. Thought content normal. His mood appears not anxious. His affect is blunt. His affect is not labile and not inappropriate. Thought content is not paranoid. He does not exhibit a depressed mood. He expresses no homicidal and no suicidal ideation.          Assessment & Plan:   Problem List Items Addressed This Visit     Respiratory   REACTIVE AIRWAY DISEASE - Primary     Given albuterol mdi px to have on hand Rarely needs it unless uri     Relevant Medications      ALBUTEROL SULFATE HFA 108 (90 BASE) MCG/ACT IN AERS     Other   Routine general medical examination at a health care facility     Reviewed health habits including diet and exercise and skin cancer prevention Reviewed appropriate screening tests for age  Also reviewed health mt list, fam hx and immunization status , as well as social and family history    Disc imp of wt loss for health and also smoking cessation     Tobacco abuse     Disc in detail risks of smoking and possible outcomes including copd, vascular/ heart disease, cancer , respiratory and sinus infections  Pt voices understanding Commended on cutting down  Not ready to quit yet     Obesity, unspecified     Discussed how this problem influences overall health and the risks it imposes  Reviewed plan for weight loss with lower calorie diet (via better food choices and also portion control or program like weight watchers) and exercise building up to or more than 30 minutes 5 days per week including some aerobic activity

## 2014-05-18 NOTE — Assessment & Plan Note (Signed)
Given albuterol mdi px to have on hand Rarely needs it unless uri

## 2014-05-18 NOTE — Assessment & Plan Note (Signed)
Discussed how this problem influences overall health and the risks it imposes  Reviewed plan for weight loss with lower calorie diet (via better food choices and also portion control or program like weight watchers) and exercise building up to or more than 30 minutes 5 days per week including some aerobic activity    

## 2014-05-18 NOTE — Assessment & Plan Note (Signed)
Reviewed health habits including diet and exercise and skin cancer prevention Reviewed appropriate screening tests for age  Also reviewed health mt list, fam hx and immunization status , as well as social and family history    Disc imp of wt loss for health and also smoking cessation

## 2014-05-18 NOTE — Assessment & Plan Note (Signed)
Disc in detail risks of smoking and possible outcomes including copd, vascular/ heart disease, cancer , respiratory and sinus infections  Pt voices understanding Commended on cutting down  Not ready to quit yet

## 2015-08-15 ENCOUNTER — Encounter: Payer: 59 | Admitting: Family Medicine

## 2015-08-15 ENCOUNTER — Telehealth: Payer: Self-pay | Admitting: Family Medicine

## 2015-08-15 NOTE — Telephone Encounter (Signed)
Pt did not come in for their appt today for cpe. Please let me know if pt needs to be contacted immediately for follow up or no follow up needed. Best phone number to contact pt is [phone].

## 2017-10-29 ENCOUNTER — Ambulatory Visit (HOSPITAL_COMMUNITY)
Admission: EM | Admit: 2017-10-29 | Discharge: 2017-10-29 | Disposition: A | Discharge status: Home / Self Care | Payer: Medicaid Other | Attending: Internal Medicine | Admitting: Internal Medicine

## 2017-10-29 ENCOUNTER — Other Ambulatory Visit: Payer: Self-pay

## 2017-10-29 ENCOUNTER — Encounter (HOSPITAL_COMMUNITY): Payer: Self-pay | Admitting: Emergency Medicine

## 2017-10-29 DIAGNOSIS — Z79899 Other long term (current) drug therapy: Secondary | ICD-10-CM | POA: Diagnosis not present

## 2017-10-29 DIAGNOSIS — F209 Schizophrenia, unspecified: Secondary | ICD-10-CM | POA: Insufficient documentation

## 2017-10-29 DIAGNOSIS — F17210 Nicotine dependence, cigarettes, uncomplicated: Secondary | ICD-10-CM | POA: Diagnosis not present

## 2017-10-29 DIAGNOSIS — L0100 Impetigo, unspecified: Secondary | ICD-10-CM | POA: Insufficient documentation

## 2017-10-29 DIAGNOSIS — W5503XA Scratched by cat, initial encounter: Secondary | ICD-10-CM | POA: Diagnosis not present

## 2017-10-29 DIAGNOSIS — H5789 Other specified disorders of eye and adnexa: Secondary | ICD-10-CM | POA: Diagnosis not present

## 2017-10-29 DIAGNOSIS — J45909 Unspecified asthma, uncomplicated: Secondary | ICD-10-CM | POA: Diagnosis not present

## 2017-10-29 DIAGNOSIS — H5462 Unqualified visual loss, left eye, normal vision right eye: Secondary | ICD-10-CM | POA: Insufficient documentation

## 2017-10-29 DIAGNOSIS — R21 Rash and other nonspecific skin eruption: Secondary | ICD-10-CM | POA: Diagnosis present

## 2017-10-29 MED ORDER — SULFAMETHOXAZOLE-TRIMETHOPRIM 800-160 MG PO TABS: 1.0000 | ORAL_TABLET | Freq: Two times a day (BID) | ORAL | 0 refills | Status: AC

## 2017-10-29 MED ORDER — CEPHALEXIN 500 MG PO CAPS: 500.0000 mg | ORAL_CAPSULE | Freq: Four times a day (QID) | ORAL | 0 refills | Status: AC

## 2017-10-29 MED ORDER — MUPIROCIN CALCIUM 2 % EX CREA: 1.0000 "application " | TOPICAL_CREAM | Freq: Two times a day (BID) | CUTANEOUS | 0 refills | Status: DC

## 2017-10-29 MED ORDER — POLYMYXIN B-TRIMETHOPRIM 10000-0.1 UNIT/ML-% OP SOLN: 1.0000 [drp] | OPHTHALMIC | 0 refills | Status: AC

## 2017-10-29 NOTE — ED Triage Notes (Signed)
Pt c/o cold sores on his face and tis traveled down both arms, also pt states its traveled to his eyes. Bilateral eye redness.
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
## 2017-10-29 NOTE — Discharge Instructions (Signed)
Please start taking oral antibiotics and use of eye drops. May use topical ointment as well. Please call your eye doctor today to be checked in the next 24-48 hours. We will notify you if your culture indicates any necessary changes to medication regimen. If your symptoms worsen or do not improve in the next 48-36 hours or if they worsen please return to be seen or go to er.

## 2017-11-01 LAB — AEROBIC CULTURE W GRAM STAIN (SUPERFICIAL SPECIMEN): Gram Stain: NONE SEEN

## 2017-11-01 LAB — AEROBIC CULTURE  (SUPERFICIAL SPECIMEN)

## 2019-08-05 ENCOUNTER — Emergency Department (HOSPITAL_COMMUNITY): Payer: Medicaid Other

## 2019-08-05 ENCOUNTER — Inpatient Hospital Stay (HOSPITAL_COMMUNITY)
Admission: EM | Admit: 2019-08-05 | Discharge: 2019-08-07 | DRG: 200 | Disposition: A | Discharge status: Home / Self Care | Payer: Medicaid Other | Attending: General Surgery | Admitting: General Surgery

## 2019-08-05 ENCOUNTER — Other Ambulatory Visit: Payer: Self-pay

## 2019-08-05 ENCOUNTER — Encounter (HOSPITAL_COMMUNITY): Payer: Self-pay | Admitting: Emergency Medicine

## 2019-08-05 DIAGNOSIS — S271XXA Traumatic hemothorax, initial encounter: Principal | ICD-10-CM | POA: Diagnosis present

## 2019-08-05 DIAGNOSIS — S2241XA Multiple fractures of ribs, right side, initial encounter for closed fracture: Secondary | ICD-10-CM | POA: Diagnosis present

## 2019-08-05 DIAGNOSIS — Z20828 Contact with and (suspected) exposure to other viral communicable diseases: Secondary | ICD-10-CM | POA: Diagnosis present

## 2019-08-05 DIAGNOSIS — Z833 Family history of diabetes mellitus: Secondary | ICD-10-CM

## 2019-08-05 DIAGNOSIS — Z885 Allergy status to narcotic agent status: Secondary | ICD-10-CM

## 2019-08-05 DIAGNOSIS — R7989 Other specified abnormal findings of blood chemistry: Secondary | ICD-10-CM | POA: Diagnosis present

## 2019-08-05 DIAGNOSIS — Z809 Family history of malignant neoplasm, unspecified: Secondary | ICD-10-CM

## 2019-08-05 DIAGNOSIS — Y92410 Unspecified street and highway as the place of occurrence of the external cause: Secondary | ICD-10-CM

## 2019-08-05 DIAGNOSIS — F17210 Nicotine dependence, cigarettes, uncomplicated: Secondary | ICD-10-CM | POA: Diagnosis present

## 2019-08-05 DIAGNOSIS — E669 Obesity, unspecified: Secondary | ICD-10-CM | POA: Diagnosis present

## 2019-08-05 DIAGNOSIS — S27321A Contusion of lung, unilateral, initial encounter: Secondary | ICD-10-CM | POA: Diagnosis present

## 2019-08-05 DIAGNOSIS — S27329A Contusion of lung, unspecified, initial encounter: Secondary | ICD-10-CM | POA: Diagnosis present

## 2019-08-05 DIAGNOSIS — Z823 Family history of stroke: Secondary | ICD-10-CM

## 2019-08-05 DIAGNOSIS — Z8249 Family history of ischemic heart disease and other diseases of the circulatory system: Secondary | ICD-10-CM

## 2019-08-05 LAB — CBC
HCT: 41.1 % (ref 39.0–52.0)
Hemoglobin: 13.5 g/dL (ref 13.0–17.0)
MCH: 34.5 pg — ABNORMAL HIGH (ref 26.0–34.0)
MCHC: 32.8 g/dL (ref 30.0–36.0)
MCV: 105.1 fL — ABNORMAL HIGH (ref 80.0–100.0)
Platelets: 315 10*3/uL (ref 150–400)
RBC: 3.91 MIL/uL — ABNORMAL LOW (ref 4.22–5.81)
RDW: 13.2 % (ref 11.5–15.5)
WBC: 13.1 10*3/uL — ABNORMAL HIGH (ref 4.0–10.5)
nRBC: 0.2 % (ref 0.0–0.2)

## 2019-08-05 LAB — COMPREHENSIVE METABOLIC PANEL
ALT: 43 U/L (ref 0–44)
AST: 90 U/L — ABNORMAL HIGH (ref 15–41)
Albumin: 4.2 g/dL (ref 3.5–5.0)
Alkaline Phosphatase: 51 U/L (ref 38–126)
Anion gap: 16 — ABNORMAL HIGH (ref 5–15)
BUN: 6 mg/dL (ref 6–20)
CO2: 20 mmol/L — ABNORMAL LOW (ref 22–32)
Calcium: 9 mg/dL (ref 8.9–10.3)
Chloride: 102 mmol/L (ref 98–111)
Creatinine, Ser: 0.83 mg/dL (ref 0.61–1.24)
GFR calc Af Amer: >60 mL/min (ref >60)
GFR calc non Af Amer: >60 mL/min (ref >60)
Glucose, Bld: 140 mg/dL — ABNORMAL HIGH (ref 70–99)
Potassium: 3.1 mmol/L — ABNORMAL LOW (ref 3.5–5.1)
Sodium: 138 mmol/L (ref 135–145)
Total Bilirubin: 0.7 mg/dL (ref 0.3–1.2)
Total Protein: 7 g/dL (ref 6.5–8.1)

## 2019-08-05 LAB — SAMPLE TO BLOOD BANK

## 2019-08-05 LAB — SARS CORONAVIRUS 2 BY RT PCR (HOSPITAL ORDER, PERFORMED IN ~~LOC~~ HOSPITAL LAB): SARS Coronavirus 2: NEGATIVE

## 2019-08-05 LAB — PROTIME-INR
INR: 1 (ref 0.8–1.2)
Prothrombin Time: 13.5 seconds (ref 11.4–15.2)

## 2019-08-05 LAB — LACTIC ACID, PLASMA
Lactic Acid, Venous: 4.3 mmol/L (ref 0.5–1.9)
Lactic Acid, Venous: 4.5 mmol/L (ref 0.5–1.9)

## 2019-08-05 LAB — ETHANOL: Alcohol, Ethyl (B): 132 mg/dL — ABNORMAL HIGH (ref <10)

## 2019-08-05 MED ORDER — TETANUS-DIPHTH-ACELL PERTUSSIS 5-2.5-18.5 LF-MCG/0.5 IM SUSP
0.5000 mL | Freq: Once | INTRAMUSCULAR | Status: AC
  Administered 2019-08-05: 0.5 mL via INTRAMUSCULAR
  Filled 2019-08-05: qty 0.5

## 2019-08-05 MED ORDER — ENOXAPARIN SODIUM 40 MG/0.4ML ~~LOC~~ SOLN
40.0000 mg | SUBCUTANEOUS | Status: DC
  Administered 2019-08-05 – 2019-08-06 (×2): 40 mg via SUBCUTANEOUS
  Filled 2019-08-05 (×2): qty 0.4

## 2019-08-05 MED ORDER — ACETAMINOPHEN 325 MG PO TABS: 650.0000 mg | ORAL_TABLET | ORAL | Status: DC | PRN

## 2019-08-05 MED ORDER — OXYCODONE HCL 5 MG PO TABS
5.0000 mg | ORAL_TABLET | ORAL | Status: DC | PRN
  Administered 2019-08-06 (×2): 5 mg via ORAL
  Filled 2019-08-05 (×2): qty 1

## 2019-08-05 MED ORDER — MORPHINE SULFATE (PF) 2 MG/ML IV SOLN
2.0000 mg | INTRAVENOUS | Status: DC | PRN
  Administered 2019-08-05 – 2019-08-06 (×3): 2 mg via INTRAVENOUS
  Administered 2019-08-07: 4 mg via INTRAVENOUS
  Filled 2019-08-05 (×3): qty 1
  Filled 2019-08-05: qty 2

## 2019-08-05 MED ORDER — ONDANSETRON 4 MG PO TBDP: 4.0000 mg | ORAL_TABLET | Freq: Four times a day (QID) | ORAL | Status: DC | PRN

## 2019-08-05 MED ORDER — LACTATED RINGERS IV BOLUS
1000.0000 mL | Freq: Once | INTRAVENOUS | Status: AC
  Administered 2019-08-05: 18:00:00 1000 mL via INTRAVENOUS

## 2019-08-05 MED ORDER — IOHEXOL 300 MG/ML  SOLN
100.0000 mL | Freq: Once | INTRAMUSCULAR | Status: AC | PRN
  Administered 2019-08-05: 100 mL via INTRAVENOUS

## 2019-08-05 MED ORDER — ONDANSETRON HCL 4 MG/2ML IJ SOLN
4.0000 mg | Freq: Four times a day (QID) | INTRAMUSCULAR | Status: DC | PRN
  Administered 2019-08-05: 4 mg via INTRAVENOUS
  Filled 2019-08-05: qty 2

## 2019-08-05 MED ORDER — SODIUM CHLORIDE 0.9 % IV SOLN
INTRAVENOUS | Status: DC
  Administered 2019-08-05 – 2019-08-06 (×2): via INTRAVENOUS

## 2019-08-05 MED ORDER — MORPHINE SULFATE (PF) 4 MG/ML IV SOLN
4.0000 mg | Freq: Once | INTRAVENOUS | Status: AC
  Administered 2019-08-05: 15:00:00 4 mg via INTRAVENOUS
  Filled 2019-08-05: qty 1

## 2019-08-05 NOTE — ED Notes (Signed)
Patient transported to X-ray 
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
## 2019-08-05 NOTE — ED Notes (Signed)
Pt able to move all extremities. Pt denies neck and back pain, denies numbness and tingling.
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
## 2019-08-05 NOTE — ED Notes (Signed)
When pt takes a breath, I can see the back right side ribs flail

## 2019-08-05 NOTE — ED Notes (Signed)
Patient transported to CT 

## 2019-08-05 NOTE — ED Notes (Signed)
  Attempted to call report.  RN will call me back. 
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
## 2019-08-06 DIAGNOSIS — Y92410 Unspecified street and highway as the place of occurrence of the external cause: Secondary | ICD-10-CM | POA: Diagnosis not present

## 2019-08-06 DIAGNOSIS — R7989 Other specified abnormal findings of blood chemistry: Secondary | ICD-10-CM | POA: Diagnosis present

## 2019-08-06 DIAGNOSIS — Z823 Family history of stroke: Secondary | ICD-10-CM | POA: Diagnosis not present

## 2019-08-06 DIAGNOSIS — Z809 Family history of malignant neoplasm, unspecified: Secondary | ICD-10-CM | POA: Diagnosis not present

## 2019-08-06 DIAGNOSIS — S27321A Contusion of lung, unilateral, initial encounter: Secondary | ICD-10-CM | POA: Diagnosis present

## 2019-08-06 DIAGNOSIS — Z8249 Family history of ischemic heart disease and other diseases of the circulatory system: Secondary | ICD-10-CM | POA: Diagnosis not present

## 2019-08-06 DIAGNOSIS — S2241XA Multiple fractures of ribs, right side, initial encounter for closed fracture: Secondary | ICD-10-CM | POA: Diagnosis present

## 2019-08-06 DIAGNOSIS — Z20828 Contact with and (suspected) exposure to other viral communicable diseases: Secondary | ICD-10-CM | POA: Diagnosis present

## 2019-08-06 DIAGNOSIS — Z833 Family history of diabetes mellitus: Secondary | ICD-10-CM | POA: Diagnosis not present

## 2019-08-06 DIAGNOSIS — Z885 Allergy status to narcotic agent status: Secondary | ICD-10-CM | POA: Diagnosis not present

## 2019-08-06 DIAGNOSIS — S271XXA Traumatic hemothorax, initial encounter: Secondary | ICD-10-CM | POA: Diagnosis not present

## 2019-08-06 DIAGNOSIS — E669 Obesity, unspecified: Secondary | ICD-10-CM | POA: Diagnosis present

## 2019-08-06 DIAGNOSIS — F17210 Nicotine dependence, cigarettes, uncomplicated: Secondary | ICD-10-CM | POA: Diagnosis present

## 2019-08-06 LAB — URINALYSIS, ROUTINE W REFLEX MICROSCOPIC
Bilirubin Urine: NEGATIVE
Glucose, UA: NEGATIVE mg/dL
Ketones, ur: 20 mg/dL — AB
Leukocytes,Ua: NEGATIVE
Nitrite: NEGATIVE
Protein, ur: NEGATIVE mg/dL
Specific Gravity, Urine: 1.021 (ref 1.005–1.030)
pH: 6 (ref 5.0–8.0)

## 2019-08-06 LAB — LACTIC ACID, PLASMA: Lactic Acid, Venous: 2.3 mmol/L (ref 0.5–1.9)

## 2019-08-06 LAB — HIV ANTIBODY (ROUTINE TESTING W REFLEX): HIV Screen 4th Generation wRfx: NONREACTIVE

## 2019-08-06 LAB — CDS SEROLOGY

## 2019-08-06 MED ORDER — SODIUM CHLORIDE 0.9 % IV SOLN
Freq: Once | INTRAVENOUS | Status: AC
  Administered 2019-08-06: via INTRAVENOUS

## 2019-08-06 MED ORDER — ACETAMINOPHEN 500 MG PO TABS
1000.0000 mg | ORAL_TABLET | Freq: Four times a day (QID) | ORAL | Status: DC
  Administered 2019-08-06 (×3): 1000 mg via ORAL
  Filled 2019-08-06 (×3): qty 2

## 2019-08-06 MED ORDER — KETOROLAC TROMETHAMINE 15 MG/ML IJ SOLN
15.0000 mg | Freq: Three times a day (TID) | INTRAMUSCULAR | Status: DC | PRN
  Administered 2019-08-06 (×2): 15 mg via INTRAVENOUS
  Filled 2019-08-06 (×2): qty 1

## 2019-08-06 MED ORDER — ACETAMINOPHEN 500 MG PO TABS: 1000.0000 mg | ORAL_TABLET | Freq: Four times a day (QID) | ORAL | Status: DC

## 2019-08-06 NOTE — Progress Notes (Signed)
MD made aware of the lactic acid results.
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
## 2019-08-06 NOTE — Progress Notes (Signed)
CRITICAL VALUE ALERT  Critical Value:Lactic Acid =4.5  Date & Time Notied: 08/05/19  2344  Provider Notified:Dr Kinsinger  Orders Received/Actions taken: Yes

## 2019-08-07 ENCOUNTER — Inpatient Hospital Stay (HOSPITAL_COMMUNITY): Payer: Medicaid Other

## 2019-08-07 MED ORDER — IBUPROFEN 800 MG PO TABS: 800.0000 mg | ORAL_TABLET | Freq: Three times a day (TID) | ORAL | 0 refills | Status: DC | PRN

## 2019-08-07 MED ORDER — OXYCODONE HCL 5 MG PO TABS: 5.0000 mg | ORAL_TABLET | Freq: Four times a day (QID) | ORAL | 0 refills | Status: DC | PRN
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
## 2019-08-07 NOTE — Progress Notes (Signed)
Hunter [name] to be D/C'd  per MD order. Discussed with the patient and all questions fully answered.  VSS, Skin clean, dry and intact without evidence of skin break down, no evidence of skin tears noted.  IV catheter discontinued intact. Site without signs and symptoms of complications. Dressing and pressure applied.  An After Visit Summary was printed and given to the patient. Patient informed prescription.  D/c education completed with patient/family including follow up instructions, medication list, d/c activities limitations if indicated, with other d/c instructions as indicated by MD - patient able to verbalize understanding, all questions fully answered.   Patient instructed to return to ED, call 911, or call MD for any changes in condition.   Patient to be escorted via [place], and D/C home via private auto.
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
## 2020-02-05 ENCOUNTER — Other Ambulatory Visit: Payer: Self-pay

## 2020-02-05 ENCOUNTER — Ambulatory Visit (HOSPITAL_COMMUNITY)
Admission: EM | Admit: 2020-02-05 | Discharge: 2020-02-05 | Disposition: A | Discharge status: Home / Self Care | Payer: Medicaid Other | Attending: Emergency Medicine | Admitting: Emergency Medicine

## 2020-02-05 ENCOUNTER — Emergency Department (HOSPITAL_BASED_OUTPATIENT_CLINIC_OR_DEPARTMENT_OTHER): Payer: Medicaid Other

## 2020-02-05 ENCOUNTER — Ambulatory Visit (INDEPENDENT_AMBULATORY_CARE_PROVIDER_SITE_OTHER): Payer: Medicaid Other

## 2020-02-05 ENCOUNTER — Encounter (HOSPITAL_COMMUNITY): Payer: Self-pay

## 2020-02-05 ENCOUNTER — Emergency Department (HOSPITAL_COMMUNITY)
Admission: EM | Admit: 2020-02-05 | Discharge: 2020-02-05 | Disposition: A | Discharge status: Home / Self Care | Payer: Medicaid Other | Attending: Emergency Medicine | Admitting: Emergency Medicine

## 2020-02-05 ENCOUNTER — Encounter (HOSPITAL_COMMUNITY): Payer: Self-pay | Admitting: Emergency Medicine

## 2020-02-05 DIAGNOSIS — M7989 Other specified soft tissue disorders: Secondary | ICD-10-CM

## 2020-02-05 DIAGNOSIS — M25562 Pain in left knee: Secondary | ICD-10-CM | POA: Diagnosis not present

## 2020-02-05 DIAGNOSIS — M25462 Effusion, left knee: Secondary | ICD-10-CM | POA: Diagnosis not present

## 2020-02-05 DIAGNOSIS — R2242 Localized swelling, mass and lump, left lower limb: Secondary | ICD-10-CM | POA: Insufficient documentation

## 2020-02-05 DIAGNOSIS — F17210 Nicotine dependence, cigarettes, uncomplicated: Secondary | ICD-10-CM | POA: Diagnosis not present

## 2020-02-05 DIAGNOSIS — M79662 Pain in left lower leg: Secondary | ICD-10-CM

## 2020-02-05 MED ORDER — ONDANSETRON 4 MG PO TBDP
4.0000 mg | ORAL_TABLET | Freq: Once | ORAL | Status: AC
  Administered 2020-02-05: 4 mg via ORAL
  Filled 2020-02-05: qty 1

## 2020-02-05 MED ORDER — ONDANSETRON 4 MG PO TBDP: 4.0000 mg | ORAL_TABLET | Freq: Three times a day (TID) | ORAL | 1 refills | Status: DC | PRN

## 2020-02-05 MED ORDER — HYDROCODONE-ACETAMINOPHEN 5-325 MG PO TABS: 1.0000 | ORAL_TABLET | Freq: Four times a day (QID) | ORAL | 0 refills | Status: DC | PRN

## 2020-02-05 MED ORDER — HYDROCODONE-ACETAMINOPHEN 5-325 MG PO TABS
1.0000 | ORAL_TABLET | Freq: Once | ORAL | Status: AC
  Administered 2020-02-05: 1 via ORAL
  Filled 2020-02-05: qty 1

## 2020-02-05 MED ORDER — PREDNISONE 10 MG PO TABS: 40.0000 mg | ORAL_TABLET | Freq: Every day | ORAL | 0 refills | Status: DC

## 2020-02-05 NOTE — ED Notes (Signed)
Ortho tech at bedside 
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
## 2020-02-05 NOTE — ED Triage Notes (Signed)
C/o L knee and L lower leg pain and swelling since Friday.  No known injury.  States it feels like a muscle cramp in L calf.

## 2020-02-05 NOTE — Progress Notes (Signed)
VASCULAR LAB PRELIMINARY  PRELIMINARY  PRELIMINARY  PRELIMINARY  Left lower extremity venous duplex completed.    Preliminary report:  See CV proc for preliminary results.  Gave Dr. Deretha Emory results.   Roselia Snipe, RVT 02/05/2020, 4:21 PM

## 2020-02-05 NOTE — Discharge Instructions (Signed)
Your xray does demonstrate some fluid on your knee, which is consistent with your swelling.  However, without injury, with your elevated heart rate and significant calf pain, I would like you to seek further evaluation in the ER.

## 2020-02-05 NOTE — ED Triage Notes (Signed)
Pt c/o left leg pain from knee through calf into foot onset Friday and describes as "cramping" in nature. Difficulty ambulating. Denies injury to area. States "it feels like a pulled something."  Positive sensation to toes.  Took tylenol x2 tabs just prior to arrival.  LLE warm, +2 pedal pulse, no edema or erythema to area.

## 2020-02-05 NOTE — Discharge Instructions (Signed)
Make an appointment to follow-up with your regular doctor.  Make an appointment follow-up with orthopedics.  Take prednisone as directed for the next 5 days.  Take the pain medicine as needed.  Take Zofran as needed for any nausea or vomiting from the pain medicine.  Keep the knee immobilizer in place use the crutches.  He can remove the knee immobilizer to shower.  Work note provided.  Ultrasound study showed no evidence of vein blood clot.  But you do have a lot of swelling inside the knee joint.

## 2020-02-05 NOTE — ED Notes (Signed)
Return demonstration of crutches use. Pt verbalized understanding of all discharge and follow up instructions.
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
## 2020-02-05 NOTE — Progress Notes (Signed)
Orthopedic Tech Progress Note Patient Details:  Hunter [name] 10-28-1991 [id number]  Ortho Devices Type of Ortho Device: Knee Immobilizer, Crutches Ortho Device/Splint Location: left Ortho Device/Splint Interventions: Application   Post Interventions Patient Tolerated: Well Instructions Provided: Care of device   Saul Fordyce 02/05/2020, 6:39 PM
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
## 2020-08-25 IMAGING — DX PORTABLE CHEST - 1 VIEW
1 series · 1 of 1 positions shown · non-contrast
Comparison: 08/05/2019

CLINICAL DATA: Pulmonary contusion

EXAM:
PORTABLE CHEST 1 VIEW

[chest ap]
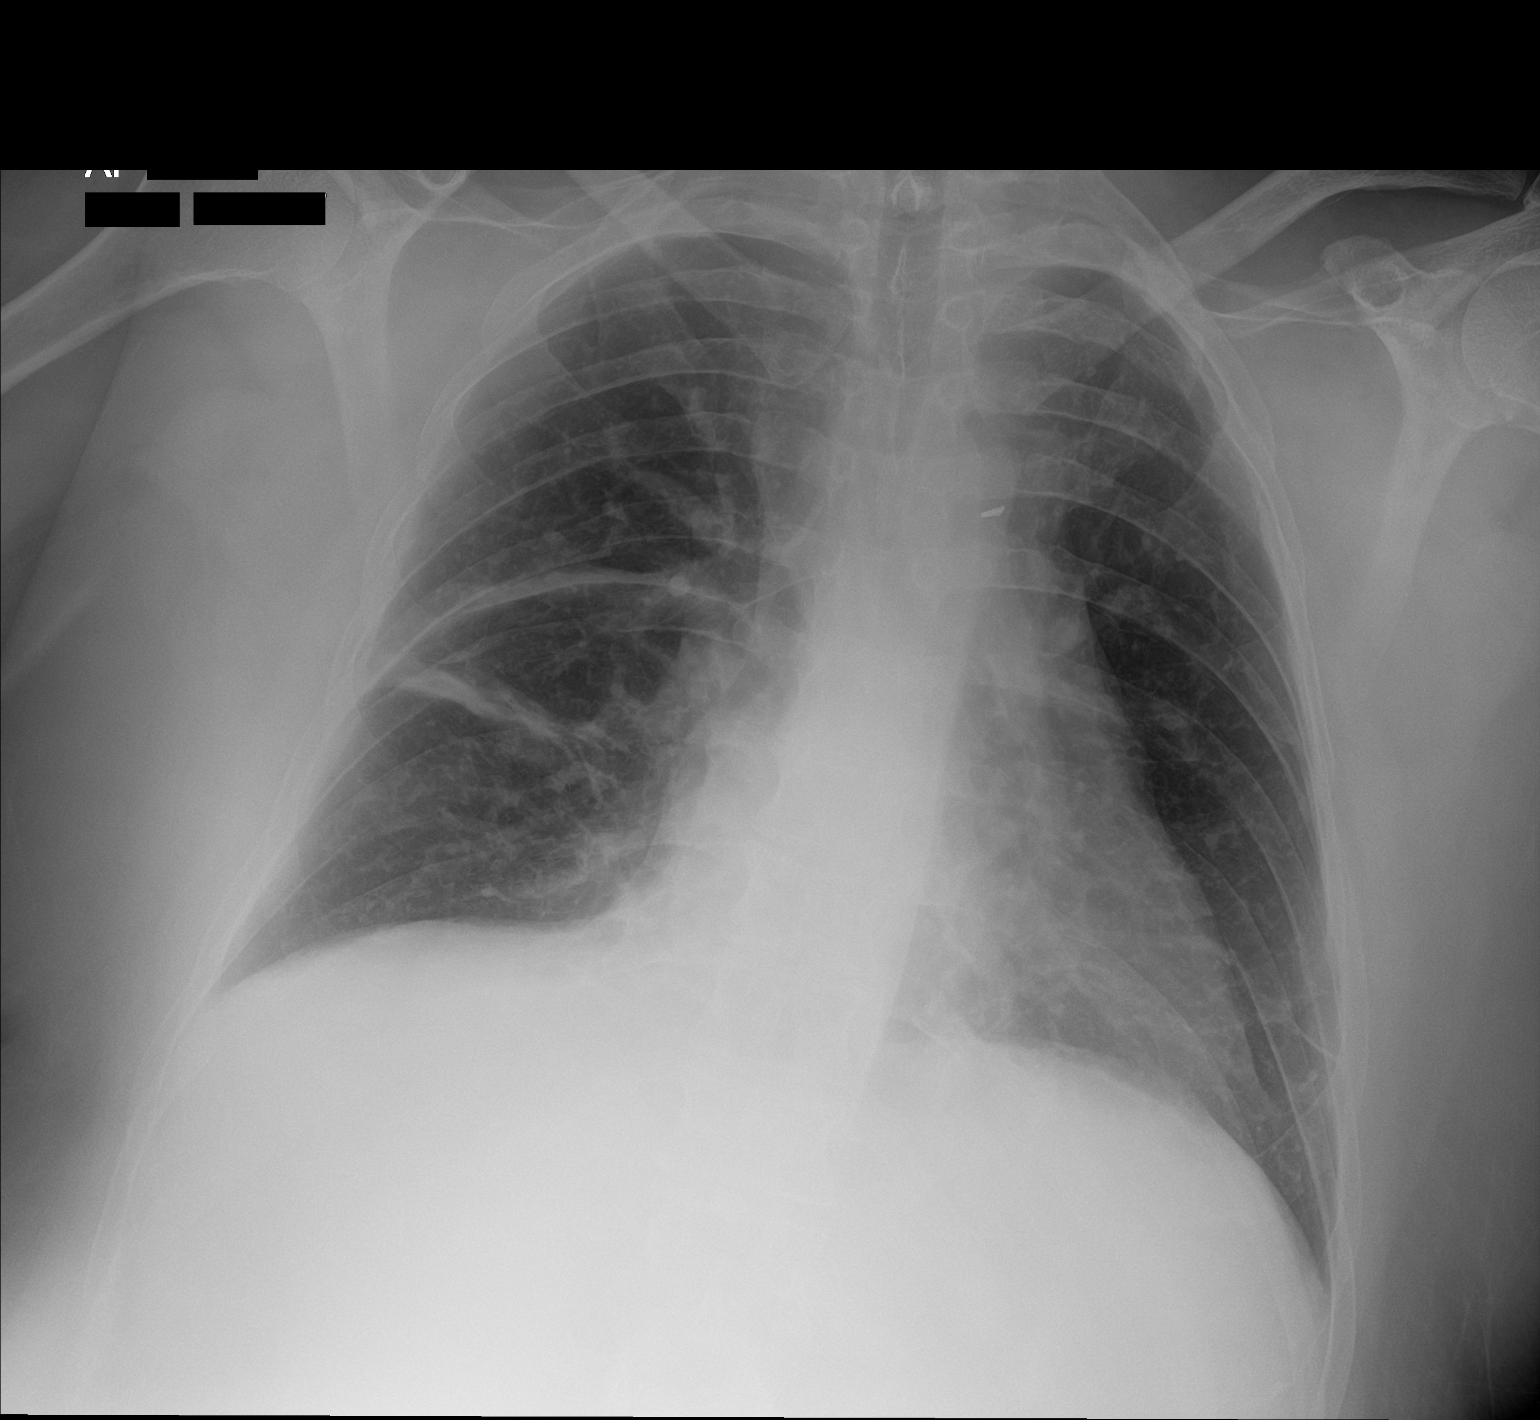

[1 of 1 positions shown; findings below may reference images not displayed]

FINDINGS: Heart size is accentuated by portable technique. No pneumothorax.
There is streaky atelectasis in the RIGHT mid lung zone. Acute
RIGHT-sided rib fractures again noted and better seen on previous CT
exam.
IMPRESSION: 1. Acute RIGHT rib fractures.
2. No pneumothorax.

## 2024-08-24 ENCOUNTER — Ambulatory Visit: Payer: Self-pay

## 2024-08-24 NOTE — Telephone Encounter (Signed)
 FYI Only or Action Required?: FYI only for provider.  Patient was last seen in primary care on unkown.  Called Nurse Triage reporting Hypertension.  Symptoms began yesterday.  Interventions attempted: OTC medications: Tylenol  and Ibuprofen .  Symptoms are: gradually worsening.  Triage Disposition: See HCP Within 4 Hours (Or PCP Triage)  Patient/caregiver understands and will follow disposition?:      Copied from CRM #[id number]. Topic: Clinical - Red Word Triage >> Aug 24, 2024 11:54 AM Carlyon D wrote: Red Word that prompted transfer to Nurse Triage: Pt mother Lonell calling in regards to son having chest pain and tightness, pt also has been having high blood pressure reading was 180/130 then it went down to 138 over something mother is not 100 percent on the bottom number but son is not with her son at the moment. Reason for Disposition  [1] Systolic BP >= 200 OR Diastolic >= 120 AND [2] having NO cardiac or neurologic symptoms  Answer Assessment - Initial Assessment Questions Spoke with patient's mom Dena regarding symptoms. Mom unsure when symptoms started but he mentioned it to her yesterday and patient not nearby. Mom states blood pressure was also high this year at his dental appointment.  Mom states she believes the patient is dehydrated a lot of the time. Pt took ibuprofen  and tylenol  for symptoms. Advised to seek care today at nearest ED/UC. Patient requesting a new patient appointment. Call disconnected during scheduling for new patient appointment. This RN was able to reconnect with patient's mom and new patient apportionment scheduled.    1. BLOOD PRESSURE: What is your blood pressure? Did you take at least two measurements 5 minutes apart?     180/130, 138/100  2. ONSET: When did you take your blood pressure?     Yesterday  3. HOW: How did you take your blood pressure? (e.g., automatic home BP monitor, visiting nurse)     BP monitor at home  4. HISTORY: Do you have a  history of high blood pressure?     Denies  5. MEDICINES: Are you taking any medicines for blood pressure? Have you missed any doses recently?     Denies  6. OTHER SYMPTOMS: Do you have any symptoms? (e.g., blurred vision, chest pain, difficulty breathing, headache, weakness)     Chest pain and tightness and possible headache.  Protocols used: Blood Pressure - High-A-AH

## 2024-10-05 ENCOUNTER — Encounter: Payer: Self-pay | Admitting: General Practice

## 2024-10-05 ENCOUNTER — Ambulatory Visit (INDEPENDENT_AMBULATORY_CARE_PROVIDER_SITE_OTHER): Payer: MEDICAID | Admitting: General Practice

## 2024-10-05 ENCOUNTER — Ambulatory Visit
Admission: RE | Admit: 2024-10-05 | Discharge: 2024-10-05 | Disposition: A | Payer: MEDICAID | Source: Ambulatory Visit | Attending: General Practice | Admitting: General Practice

## 2024-10-05 VITALS — BP 128/80 | HR 77 | Temp 98.0°F | Ht 62.0 in | Wt 189.0 lb

## 2024-10-05 DIAGNOSIS — R0602 Shortness of breath: Secondary | ICD-10-CM

## 2024-10-05 DIAGNOSIS — M79671 Pain in right foot: Secondary | ICD-10-CM | POA: Diagnosis not present

## 2024-10-05 DIAGNOSIS — Z23 Encounter for immunization: Secondary | ICD-10-CM

## 2024-10-05 DIAGNOSIS — J4520 Mild intermittent asthma, uncomplicated: Secondary | ICD-10-CM

## 2024-10-05 DIAGNOSIS — Z7689 Persons encountering health services in other specified circumstances: Secondary | ICD-10-CM | POA: Insufficient documentation

## 2024-10-05 DIAGNOSIS — Z720 Tobacco use: Secondary | ICD-10-CM

## 2024-10-05 LAB — COMPREHENSIVE METABOLIC PANEL WITH GFR
ALT: 27 U/L (ref 0–53)
AST: 23 U/L (ref 0–37)
Albumin: 4.5 g/dL (ref 3.5–5.2)
Alkaline Phosphatase: 62 U/L (ref 39–117)
BUN: 9 mg/dL (ref 6–23)
CO2: 27 meq/L (ref 19–32)
Calcium: 9.4 mg/dL (ref 8.4–10.5)
Chloride: 103 meq/L (ref 96–112)
Creatinine, Ser: 0.77 mg/dL (ref 0.40–1.50)
GFR: 116.81 mL/min (ref >60.00)
Glucose, Bld: 115 mg/dL — ABNORMAL HIGH (ref 70–99)
Potassium: 3.6 meq/L (ref 3.5–5.1)
Sodium: 141 meq/L (ref 135–145)
Total Bilirubin: 0.4 mg/dL (ref 0.2–1.2)
Total Protein: 7.6 g/dL (ref 6.0–8.3)

## 2024-10-05 LAB — CBC
HCT: 42.4 % (ref 39.0–52.0)
Hemoglobin: 14.1 g/dL (ref 13.0–17.0)
MCHC: 33.2 g/dL (ref 30.0–36.0)
MCV: 102.9 fl — ABNORMAL HIGH (ref 78.0–100.0)
Platelets: 447 K/uL — ABNORMAL HIGH (ref 150.0–400.0)
RBC: 4.12 Mil/uL — ABNORMAL LOW (ref 4.22–5.81)
RDW: 13.3 % (ref 11.5–15.5)
WBC: 6.9 K/uL (ref 4.0–10.5)

## 2024-10-05 LAB — BRAIN NATRIURETIC PEPTIDE: Pro B Natriuretic peptide (BNP): 22 pg/mL (ref 0.0–100.0)

## 2024-10-05 MED ORDER — PREDNISONE 10 MG (21) PO TBPK: ORAL_TABLET | ORAL | 0 refills | Status: DC

## 2024-10-05 MED ORDER — BUDESONIDE-FORMOTEROL FUMARATE 80-4.5 MCG/ACT IN AERO: 2.0000 | INHALATION_SPRAY | Freq: Two times a day (BID) | RESPIRATORY_TRACT | 3 refills | Status: AC

## 2024-10-05 MED ORDER — ALBUTEROL SULFATE HFA 108 (90 BASE) MCG/ACT IN AERS: 2.0000 | INHALATION_SPRAY | Freq: Four times a day (QID) | RESPIRATORY_TRACT | 0 refills | Status: AC | PRN

## 2024-10-05 NOTE — Patient Instructions (Addendum)
 Stop by the lab prior to leaving today. I will notify you of your results once received.   Complete xray(s) prior to leaving today. I will notify you of your results once received.  Start prednisone  taper. Follow instructions on the package.   Start Symbicort 2 puffs twice daily.  Start Albuterol  as needed.  As discussed, please cut back on smoking.  Follow up in 4 weeks for foot pain; physical and fasting labs.   It was a pleasure to meet you today! Please don't hesitate to contact me with any questions. Welcome to [facility]!

## 2024-10-05 NOTE — Progress Notes (Signed)
 New Patient Office Visit  Subjective    Patient ID: Hunter [name], male    DOB: 11/30/1991  Age: 33 y.o. MRN: [id number]  CC:  Chief Complaint  Patient presents with   New Patient (Initial Visit)   Foot Pain    In right foot with some swelling since Saturday. Patient denies any injuries to foot.     HPI Hunter [name] is a 33 y.o. male presents to establish care.  Previous PCP/physical/labs: Dr. Morgan - many years ago.  Discussed the use of AI scribe software for clinical note transcription with the patient, who gave verbal consent to proceed.  History of Present Illness Hunter [name] is a 33 year old male with asthma who presents with shortness of breath and foot pain.  He has experienced shortness of breath for approximately one year, with difficulty catching his breath during exercise and while walking long distances. He has a history of asthma diagnosed in childhood, for which he used an inhaler until the age of 19 or 79. He has not used an inhaler since then but feels it might help now. No chest pain or blurred vision. He does smokes cigarettes daily.   He reports foot pain and swelling since Saturday, with no history of injury. The pain is located at the bottom of the heel and worsens with weight-bearing activities. It does not improve throughout the day but has been gradually getting better over several days. He has been using Tylenol  for pain relief, which he states has been helpful. He is able to bear weight but is limping due to the pain. The pain is described as tender and more pronounced when moving the foot upwards.  He has a history of high blood pressure with previous readings as high as 180/130 and 138/100. His current blood pressure is 128/80. He has not been on any medication for hypertension.  Family history is significant for his father having a heart attack in his sixties, prompting concern about his own cardiovascular health.    Outpatient Encounter  Medications as of 10/05/2024  Medication Sig   albuterol  (VENTOLIN  HFA) 108 (90 Base) MCG/ACT inhaler Inhale 2 puffs into the lungs every 6 (six) hours as needed for wheezing or shortness of breath.   budesonide-formoterol (SYMBICORT) 80-4.5 MCG/ACT inhaler Inhale 2 puffs into the lungs 2 (two) times daily.   predniSONE  (STERAPRED UNI-PAK 21 TAB) 10 MG (21) TBPK tablet Follow instructions on package.   [DISCONTINUED] HYDROcodone -acetaminophen  (NORCO/VICODIN) 5-325 MG tablet Take 1 tablet by mouth every 6 (six) hours as needed for moderate pain.   [DISCONTINUED] ibuprofen  (ADVIL ) 800 MG tablet Take 1 tablet (800 mg total) by mouth every 8 (eight) hours as needed. (Patient not taking: Reported on 02/05/2020)   [DISCONTINUED] ondansetron  (ZOFRAN  ODT) 4 MG disintegrating tablet Take 1 tablet (4 mg total) by mouth every 8 (eight) hours as needed for nausea or vomiting.   [DISCONTINUED] oxyCODONE  (OXY IR/ROXICODONE ) 5 MG immediate release tablet Take 1 tablet (5 mg total) by mouth every 6 (six) hours as needed for severe pain. (Patient not taking: Reported on 02/05/2020)   [DISCONTINUED] predniSONE  (DELTASONE ) 10 MG tablet Take 4 tablets (40 mg total) by mouth daily.   No facility-administered encounter medications on file as of 10/05/2024.    Past Medical History:  Diagnosis Date   Asthma    Hypertension    Mental disorder    Pneumonia    Premature birth    Reactive airway disease  Retinal detachment    Right arm fracture 08/13/2012   Presents w/ right arm wrapped & in a sling; previously broken in [facility]    Past Surgical History:  Procedure Laterality Date   arm surgery     33 years old   CARDIAC SURGERY     [facility]  2011   multiple fractures and surgery.   ORIF HUMERUS FRACTURE  08/10/2012   Procedure: OPEN REDUCTION INTERNAL FIXATION (ORIF) DISTAL HUMERUS FRACTURE;  Surgeon: Eva Elsie Herring, MD;  Location: [facility];  Service: Orthopedics;  Laterality: Right;    Family History   Problem Relation Age of Onset   Hypertension Mother    Hyperlipidemia Mother    Hypertension Father    Hyperlipidemia Father    Heart disease Father    Heart attack Father    Depression Brother    Drug abuse Brother    Early death Brother    Kidney disease Maternal Grandmother    Hypertension Maternal Grandmother    Hyperlipidemia Maternal Grandmother    Heart disease Maternal Grandmother    Heart attack Maternal Grandmother    Early death Maternal Grandmother    Depression Maternal Grandmother    Diabetes Maternal Grandmother        Renal failure   Cancer Maternal Grandmother        GYN Cancer   Stroke Maternal Grandfather    Hypertension Maternal Grandfather    Alcohol abuse Maternal Grandfather    Heart failure Maternal Grandfather        Deceased ,CVA   Hypertension Paternal Grandmother    Hyperlipidemia Paternal Grandmother    Heart attack Paternal Grandmother    Early death Paternal Grandmother    Diabetes Paternal Grandmother    Hyperlipidemia Paternal Grandfather    Heart attack Paternal Grandfather    Early death Paternal Grandfather    Depression Other     Social History   Socioeconomic History   Marital status: Single    Spouse name: Not on file   Number of children: Not on file   Years of education: Not on file   Highest education level: Not on file  Occupational History   Not on file  Tobacco Use   Smoking status: Every Day    Current packs/day: 0.10    Types: Cigarettes   Smokeless tobacco: Not on file   Tobacco comments:    1 pack every 1.5-2 weeks  Substance and Sexual Activity   Alcohol use: Yes    Alcohol/week: 2.0 standard drinks of alcohol    Types: 2 Cans of beer per week    Comment: occ   Drug use: No   Sexual activity: Not Currently  Other Topics Concern   Not on file  Social History Narrative   Currently smoke 2-3 per day.   Social Drivers of Corporate investment banker Strain: Not on file  Food Insecurity: Not on file   Transportation Needs: Not on file  Physical Activity: Not on file  Stress: Not on file  Social Connections: Not on file  Intimate Partner Violence: Not on file    Review of Systems  Constitutional:  Negative for chills and fever.  Respiratory:  Positive for shortness of breath.   Cardiovascular:  Negative for chest pain.  Gastrointestinal:  Negative for abdominal pain, constipation, diarrhea, heartburn, nausea and vomiting.  Genitourinary:  Negative for dysuria, frequency and urgency.  Neurological:  Negative for dizziness and headaches.  Endo/Heme/Allergies:  Negative for polydipsia.  Psychiatric/Behavioral:  Negative for depression and suicidal ideas. The patient is not nervous/anxious.         Objective    BP 128/80   Pulse 77   Temp 98 F (36.7 C) (Oral)   Ht 5' 2 (1.575 m)   Wt 189 lb (85.7 kg)   SpO2 97%   BMI 34.57 kg/m   Physical Exam Vitals and nursing note reviewed.  Constitutional:      Appearance: Normal appearance.  Cardiovascular:     Rate and Rhythm: Normal rate and regular rhythm.     Pulses: Normal pulses.     Heart sounds: Normal heart sounds.  Pulmonary:     Effort: Pulmonary effort is normal.     Breath sounds: Normal breath sounds.  Musculoskeletal:     Right ankle: Swelling present. Tenderness present. Decreased range of motion.     Left ankle: No swelling. No tenderness. Normal range of motion.     Right foot: Decreased range of motion.     Left foot: Normal range of motion.  Neurological:     Mental Status: He is alert and oriented to person, place, and time.  Psychiatric:        Mood and Affect: Mood normal.        Behavior: Behavior normal.        Thought Content: Thought content normal.        Judgment: Judgment normal.          Assessment & Plan:  Shortness of breath -     CBC -     Comprehensive metabolic panel with GFR -     Brain natriuretic peptide  Establishing care with new doctor, encounter for Assessment &  Plan: EMR reviewed briefly.    Right foot pain -     DG Foot Complete Right -     predniSONE ; Follow instructions on package.  Dispense: 1 each; Refill: 0  Need for pneumococcal 20-valent conjugate vaccination -     Pneumococcal conjugate vaccine 20-valent  Need for influenza vaccination -     Flu vaccine trivalent PF, 6mos and older(Flulaval,Afluria,Fluarix,Fluzone)  Mild intermittent asthma without complication -     Budesonide-Formoterol Fumarate; Inhale 2 puffs into the lungs 2 (two) times daily.  Dispense: 1 each; Refill: 3 -     Albuterol  Sulfate HFA; Inhale 2 puffs into the lungs every 6 (six) hours as needed for wheezing or shortness of breath.  Dispense: 8 g; Refill: 0  Tobacco abuse Assessment & Plan: Smoking cessation instruction/counseling given:  counseled patient on the dangers of tobacco use, advised patient to stop smoking, and reviewed strategies to maximize success      Assessment and Plan Assessment & Plan Right foot pain and swelling Persistent pain and swelling without injury, limited range of motion, and only partial relief with acetaminophen . - Order x-ray of the right foot. - Prescribe prednisone  taper pack for inflammation. - Advise use of heat or ice based on comfort. - Allow use of acetaminophen  for pain management if needed.  Shortness of breath with asthma Exercise-induced shortness of breath with wheezing. Asthma history with potential contributions from smoking. Differential includes cardiac issues, anemia, and fluid overload. - Order blood work including BNP, kidney, liver, and blood counts. - Prescribe Symbicort for daily use. - Prescribe albuterol  for as-needed use. - Advise smoking cessation.  Tobacco use Current smoker with asthma and family history of heart disease. Smoking increases risk of respiratory and cardiac complications. - Advise smoking cessation. - Plan  to check cholesterol levels at next visit.  General Health  Maintenance Due for vaccinations, recommended due to asthma and smoking. - Administer flu vaccine. - Administer pneumonia vaccine.    Return in about 4 weeks (around 11/02/2024) for foot pain; physical and fasting labs.SABRA Carrol Aurora, NP

## 2024-10-05 NOTE — Assessment & Plan Note (Signed)
 Smoking cessation instruction/counseling given:  counseled patient on the dangers of tobacco use, advised patient to stop smoking, and reviewed strategies to maximize success

## 2024-10-05 NOTE — Assessment & Plan Note (Signed)
 EMR reviewed briefly.

## 2024-10-06 ENCOUNTER — Ambulatory Visit: Payer: Self-pay | Admitting: General Practice

## 2024-11-08 ENCOUNTER — Encounter: Payer: Self-pay | Admitting: General Practice

## 2024-11-08 ENCOUNTER — Ambulatory Visit (INDEPENDENT_AMBULATORY_CARE_PROVIDER_SITE_OTHER): Payer: MEDICAID | Admitting: General Practice

## 2024-11-08 VITALS — BP 122/86 | HR 75 | Temp 98.3°F | Ht 62.0 in | Wt 189.0 lb

## 2024-11-08 DIAGNOSIS — E66811 Obesity, class 1: Secondary | ICD-10-CM | POA: Diagnosis not present

## 2024-11-08 DIAGNOSIS — Z13220 Encounter for screening for lipoid disorders: Secondary | ICD-10-CM

## 2024-11-08 DIAGNOSIS — Z0000 Encounter for general adult medical examination without abnormal findings: Secondary | ICD-10-CM | POA: Diagnosis not present

## 2024-11-08 DIAGNOSIS — Z6834 Body mass index (BMI) 34.0-34.9, adult: Secondary | ICD-10-CM

## 2024-11-08 DIAGNOSIS — J4520 Mild intermittent asthma, uncomplicated: Secondary | ICD-10-CM

## 2024-11-08 DIAGNOSIS — Z720 Tobacco use: Secondary | ICD-10-CM

## 2024-11-08 DIAGNOSIS — Z1159 Encounter for screening for other viral diseases: Secondary | ICD-10-CM

## 2024-11-08 DIAGNOSIS — R030 Elevated blood-pressure reading, without diagnosis of hypertension: Secondary | ICD-10-CM

## 2024-11-08 DIAGNOSIS — E6609 Other obesity due to excess calories: Secondary | ICD-10-CM | POA: Diagnosis not present

## 2024-11-08 LAB — HEMOGLOBIN A1C: Hgb A1c MFr Bld: 5.2 % (ref 4.6–6.5)

## 2024-11-08 LAB — LIPID PANEL
Cholesterol: 196 mg/dL (ref 0–200)
HDL: 53.9 mg/dL (ref >39.00)
NonHDL: 141.75
Total CHOL/HDL Ratio: 4
Triglycerides: 471 mg/dL — ABNORMAL HIGH (ref 0.0–149.0)
VLDL: 94.2 mg/dL — ABNORMAL HIGH (ref 0.0–40.0)

## 2024-11-08 LAB — LDL CHOLESTEROL, DIRECT: Direct LDL: 103 mg/dL

## 2024-11-08 LAB — TSH: TSH: 3.74 u[IU]/mL (ref 0.35–5.50)

## 2024-11-08 NOTE — Assessment & Plan Note (Signed)
 Smoking cessation instruction/counseling given:  counseled patient on the dangers of tobacco use, advised patient to stop smoking, and reviewed strategies to maximize success

## 2024-11-08 NOTE — Assessment & Plan Note (Signed)
 Reported elevated reading at dentist office.  Readings normal today and at last office visit.   Patient is asked to monitor BP at home or work, several times per month and return with written values to me in 2-3 weeks via mychart.
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
## 2024-11-08 NOTE — Assessment & Plan Note (Signed)
Immunizations UTD.  Discussed the importance of a healthy diet and regular exercise in order for weight loss, and to reduce the risk of further co-morbidity.  Exam stable. Labs pending.  Follow up in 1 year for repeat physical.  

## 2024-11-08 NOTE — Patient Instructions (Addendum)
 Stop by the lab prior to leaving today. I will notify you of your results once received.    Start monitoring your blood pressure daily, around the same time of day, for the next 2-3 weeks.  Ensure that you have rested for 30 minutes prior to checking your blood pressure.   Record your readings and notify me if you see numbers consistently at or above 130 on top and/or 90 on bottom.   Follow up in one year for physical.   It was a pleasure to see you today!

## 2024-11-08 NOTE — Assessment & Plan Note (Signed)
 Discussed the importance of healthy diet and exercise to affect sustainable weight loss.

## 2024-11-08 NOTE — Assessment & Plan Note (Signed)
 Improved.  Continue Symbicort  BID.  Continue Albuterol  as needed.

## 2024-11-09 ENCOUNTER — Ambulatory Visit: Payer: Self-pay | Admitting: General Practice

## 2024-11-09 LAB — HEPATITIS C ANTIBODY: Hepatitis C Ab: NONREACTIVE

## 2024-11-09 NOTE — Telephone Encounter (Signed)
 Copied from CRM #[id number]. Topic: Clinical - Lab/Test Results >> Nov 09, 2024  4:07 PM Rea C wrote: Reason for CRM: Patient called back. Relayed lab results.

## 2025-02-06 ENCOUNTER — Ambulatory Visit: Payer: MEDICAID | Admitting: General Practice

## 2025-11-13 ENCOUNTER — Encounter: Payer: MEDICAID | Admitting: General Practice
